# Patient Record
Sex: Female | Born: 1945 | Race: Black or African American | Hispanic: No | Smoking: Never smoker
Health system: Southern US, Community
[De-identification: ages and names within clinical notes are randomized; demographics above are authoritative.]

## PROBLEM LIST (undated history)

## (undated) HISTORY — PX: COSMETIC SURGERY: SHX468

## (undated) HISTORY — PX: TOTAL KNEE ARTHROPLASTY: SHX125

---

## 2008-10-18 ENCOUNTER — Emergency Department (HOSPITAL_BASED_OUTPATIENT_CLINIC_OR_DEPARTMENT_OTHER): Admission: EM | Admit: 2008-10-18 | Discharge: 2008-10-18 | Payer: Self-pay | Admitting: Emergency Medicine

## 2008-10-18 ENCOUNTER — Ambulatory Visit: Payer: Self-pay | Admitting: Diagnostic Radiology

## 2010-05-11 ENCOUNTER — Ambulatory Visit: Payer: Self-pay | Admitting: Interventional Radiology

## 2010-05-11 ENCOUNTER — Emergency Department (HOSPITAL_BASED_OUTPATIENT_CLINIC_OR_DEPARTMENT_OTHER): Admission: EM | Admit: 2010-05-11 | Discharge: 2010-05-11 | Payer: Self-pay | Admitting: Emergency Medicine

## 2010-05-22 ENCOUNTER — Emergency Department (HOSPITAL_BASED_OUTPATIENT_CLINIC_OR_DEPARTMENT_OTHER): Admission: EM | Admit: 2010-05-22 | Discharge: 2010-05-22 | Payer: Self-pay | Admitting: Emergency Medicine

## 2010-10-12 ENCOUNTER — Emergency Department (HOSPITAL_BASED_OUTPATIENT_CLINIC_OR_DEPARTMENT_OTHER)
Admission: EM | Admit: 2010-10-12 | Discharge: 2010-10-12 | Payer: Self-pay | Source: Home / Self Care | Admitting: Emergency Medicine

## 2011-05-15 ENCOUNTER — Encounter: Payer: Self-pay | Admitting: *Deleted

## 2011-05-15 ENCOUNTER — Emergency Department (HOSPITAL_BASED_OUTPATIENT_CLINIC_OR_DEPARTMENT_OTHER)
Admission: EM | Admit: 2011-05-15 | Discharge: 2011-05-15 | Disposition: A | Discharge status: Home / Self Care | Payer: BC Managed Care – PPO | Attending: Emergency Medicine | Admitting: Emergency Medicine

## 2011-05-15 ENCOUNTER — Emergency Department (INDEPENDENT_AMBULATORY_CARE_PROVIDER_SITE_OTHER): Payer: BC Managed Care – PPO

## 2011-05-15 DIAGNOSIS — S63509A Unspecified sprain of unspecified wrist, initial encounter: Secondary | ICD-10-CM | POA: Insufficient documentation

## 2011-05-15 DIAGNOSIS — E119 Type 2 diabetes mellitus without complications: Secondary | ICD-10-CM | POA: Insufficient documentation

## 2011-05-15 DIAGNOSIS — S61509A Unspecified open wound of unspecified wrist, initial encounter: Secondary | ICD-10-CM

## 2011-05-15 DIAGNOSIS — W19XXXA Unspecified fall, initial encounter: Secondary | ICD-10-CM

## 2011-05-15 DIAGNOSIS — M25569 Pain in unspecified knee: Secondary | ICD-10-CM

## 2011-05-15 DIAGNOSIS — Y9301 Activity, walking, marching and hiking: Secondary | ICD-10-CM | POA: Insufficient documentation

## 2011-05-15 DIAGNOSIS — S8000XA Contusion of unspecified knee, initial encounter: Secondary | ICD-10-CM | POA: Insufficient documentation

## 2011-05-15 DIAGNOSIS — S81009A Unspecified open wound, unspecified knee, initial encounter: Secondary | ICD-10-CM

## 2011-05-15 DIAGNOSIS — J45909 Unspecified asthma, uncomplicated: Secondary | ICD-10-CM | POA: Insufficient documentation

## 2011-05-15 DIAGNOSIS — Z96659 Presence of unspecified artificial knee joint: Secondary | ICD-10-CM

## 2011-05-15 DIAGNOSIS — Y92009 Unspecified place in unspecified non-institutional (private) residence as the place of occurrence of the external cause: Secondary | ICD-10-CM | POA: Insufficient documentation

## 2011-05-15 DIAGNOSIS — W010XXA Fall on same level from slipping, tripping and stumbling without subsequent striking against object, initial encounter: Secondary | ICD-10-CM | POA: Insufficient documentation

## 2011-05-15 MED ORDER — TETANUS-DIPHTH-ACELL PERTUSSIS 5-2-15.5 LF-MCG/0.5 IM SUSP
0.5000 mL | Freq: Once | INTRAMUSCULAR | Status: AC
  Administered 2011-05-15: 0.5 mL via INTRAMUSCULAR

## 2011-05-15 MED ORDER — HYDROCODONE-ACETAMINOPHEN 5-500 MG PO TABS: 1.0000 | ORAL_TABLET | Freq: Four times a day (QID) | ORAL | Status: AC | PRN

## 2011-05-15 MED ORDER — TETANUS-DIPHTH-ACELL PERTUSSIS 5-2-15.5 LF-MCG/0.5 IM SUSP
INTRAMUSCULAR | Status: AC
  Filled 2011-05-15: qty 0.5

## 2011-05-15 NOTE — ED Provider Notes (Signed)
History     Chief Complaint  Patient presents with  . Knee Injury  . Wrist Pain   Patient is a 65 y.o. female presenting with wrist pain and fall. The history is provided by the patient.  Wrist Pain This is a new problem. The symptoms are relieved by nothing.  Fall The accident occurred less than 1 hour ago. The fall occurred while walking. She fell from a height of 1 to 2 ft. She landed on a hard floor. There was no blood loss. The point of impact was the left wrist and right knee. The pain is mild. She was ambulatory at the scene. There was no drug use involved in the accident. Pertinent negatives include no fever and no numbness. She has tried nothing for the symptoms.  right knee and left wrist pain after fall.   Past Medical History  Diagnosis Date  . Asthma   . Diabetes mellitus     Past Surgical History  Procedure Date  . Cosmetic surgery     History reviewed. No pertinent family history.  History  Substance Use Topics  . Smoking status: Not on file  . Smokeless tobacco: Not on file  . Alcohol Use: No    OB History    Grav Para Term Preterm Abortions TAB SAB Ect Mult Living                  Review of Systems  Constitutional: Negative for fever and chills.  Gastrointestinal:       No abdominal pain,   Musculoskeletal: Negative for back pain and joint swelling.       Left wrist and right knee pain.   Neurological: Negative for weakness and numbness.    Physical Exam  SpO2 100%  Physical Exam  Constitutional: She appears well-developed.  HENT:  Head: Normocephalic and atraumatic.  Eyes: Pupils are equal, round, and reactive to light.  Neck: Normal range of motion. Neck supple.  Cardiovascular: Normal rate, regular rhythm and normal heart sounds.   Pulmonary/Chest: Effort normal.  Abdominal: Soft.  Musculoskeletal: She exhibits tenderness.       Arms:      Legs:      Tender with abrasion to right anterior knee. No deformity. Previous knee  replacement. Mild left wrist tenderness. ROM intact NV intact distally.     ED Course  Procedures  MDM Fall. Left wrist and right knee pain. Xrays negative. Ortho follow up.       Juliet Rude. Rubin Payor, MD 05/15/11 2202

## 2011-05-15 NOTE — ED Notes (Signed)
Patient is resting comfortably. 

## 2011-05-15 NOTE — ED Notes (Signed)
Family at bedside. 

## 2011-05-15 NOTE — ED Notes (Signed)
Pt states that her right foot slipped and she fell pt attempted to catch herself with left arm pt with abrasion to right knee and pain and swelling to left wrist

## 2011-05-15 NOTE — ED Notes (Signed)
Patient is resting comfortably. Wound cleaned awaiting radiology

## 2011-05-15 NOTE — ED Notes (Signed)
Delay explained to pt warm blankets brought will continue to monitor

## 2012-09-05 ENCOUNTER — Encounter (HOSPITAL_BASED_OUTPATIENT_CLINIC_OR_DEPARTMENT_OTHER): Payer: Self-pay | Admitting: *Deleted

## 2012-09-05 ENCOUNTER — Emergency Department (HOSPITAL_BASED_OUTPATIENT_CLINIC_OR_DEPARTMENT_OTHER)
Admission: EM | Admit: 2012-09-05 | Discharge: 2012-09-06 | Disposition: A | Discharge status: Home / Self Care | Payer: BC Managed Care – PPO | Attending: Emergency Medicine | Admitting: Emergency Medicine

## 2012-09-05 DIAGNOSIS — J45909 Unspecified asthma, uncomplicated: Secondary | ICD-10-CM | POA: Insufficient documentation

## 2012-09-05 DIAGNOSIS — R079 Chest pain, unspecified: Secondary | ICD-10-CM | POA: Insufficient documentation

## 2012-09-05 DIAGNOSIS — M25519 Pain in unspecified shoulder: Secondary | ICD-10-CM | POA: Insufficient documentation

## 2012-09-05 DIAGNOSIS — E119 Type 2 diabetes mellitus without complications: Secondary | ICD-10-CM | POA: Insufficient documentation

## 2012-09-05 DIAGNOSIS — Z79899 Other long term (current) drug therapy: Secondary | ICD-10-CM | POA: Insufficient documentation

## 2012-09-05 DIAGNOSIS — S46911A Strain of unspecified muscle, fascia and tendon at shoulder and upper arm level, right arm, initial encounter: Secondary | ICD-10-CM

## 2012-09-05 NOTE — ED Notes (Signed)
Pt c/o right chest pain radiating into her right shoulder and arm that began 4 days ago and has gotten progressively worse. Pt sts pain is worse with movement. Pt denies SOB, n/v or diaphoresis. Pt does heavy lifting at work.

## 2012-09-05 NOTE — ED Notes (Signed)
Encouraged Pt. To wait till Dr. Luberta Robertson her before she leaves the ED.  Pt. Mad about wait time.  Pt. In no distress and no chest pain.

## 2012-09-06 MED ORDER — NAPROXEN SODIUM 220 MG PO TABS: ORAL_TABLET | ORAL | Status: DC

## 2012-09-06 MED ORDER — NAPROXEN 250 MG PO TABS
500.0000 mg | ORAL_TABLET | Freq: Once | ORAL | Status: AC
  Administered 2012-09-06: 500 mg via ORAL
  Filled 2012-09-06: qty 2

## 2012-09-06 MED ORDER — HYDROCODONE-ACETAMINOPHEN 5-325 MG PO TABS: 1.0000 | ORAL_TABLET | Freq: Four times a day (QID) | ORAL | Status: DC | PRN

## 2012-09-06 NOTE — ED Provider Notes (Addendum)
History     CSN: [id number]  Arrival date & time 09/05/12  2041   First MD Initiated Contact with Patient 09/06/12 0002      Chief Complaint  Patient presents with  . Shoulder Pain    (Consider location/radiation/quality/duration/timing/severity/associated sxs/prior treatment) HPI This is a 66 year old female who has had pain in her right upper chest and shoulder for the past 4 days. She states she did a lot of lifting 4 days ago. The pain is moderate in severity and worse with palpation or movement. She denies shortness of breath, diaphoresis, nausea or vomiting. There is no associated deformity, motor deficit or sensory deficit.  Past Medical History  Diagnosis Date  . Asthma   . Diabetes mellitus     Past Surgical History  Procedure Date  . Cosmetic surgery   . Total knee arthroplasty     No family history on file.  History  Substance Use Topics  . Smoking status: Never Smoker   . Smokeless tobacco: Not on file  . Alcohol Use: No    OB History    Grav Para Term Preterm Abortions TAB SAB Ect Mult Living                  Review of Systems  All other systems reviewed and are negative.    Allergies  Review of patient's allergies indicates no known allergies.  Home Medications   Current Outpatient Rx  Name Route Sig Dispense Refill  . METFORMIN HCL ER (MOD) 1000 MG PO TB24 Oral Take 1,000 mg by mouth once.       BP 140/77  Pulse 78  Temp 98.4 F (36.9 C) (Oral)  Resp 20  Ht 5\' 5"  (1.651 m)  Wt 198 lb (89.812 kg)  BMI 32.95 kg/m2  SpO2 100%  Physical Exam General: Well-developed, well-nourished female in no acute distress; appearance consistent with age of record HENT: normocephalic, atraumatic Eyes: pupils equal round and reactive to light; extraocular muscles intact; arcus senilis bilaterally Neck: supple Heart: regular rate and rhythm Lungs: clear to auscultation bilaterally Chest: Tenderness of the right pectoralis muscles notably at  their insertion on the shoulder without swelling; no rotator cuff tenderness Abdomen: soft; nondistended; nontender Extremities: No deformity; full range of motion; right shoulder tenderness as noted above Neurologic: Awake, alert and oriented; motor function intact in all extremities and symmetric; no facial droop Skin: Warm and dry    ED Course  Procedures (including critical care time)     MDM    EKG Interpretation:  Date & Time: 09/05/2012 9:09 PM  Rate: 77  Rhythm: normal sinus rhythm  QRS Axis: normal  Intervals: normal  ST/T Wave abnormalities: normal  Conduction Disutrbances:none  Narrative Interpretation: Borderline LVH  Old EKG Reviewed: none available         Hanley Seamen, MD 09/06/12 0010  Hanley Seamen, MD 09/06/12 4098

## 2014-12-26 ENCOUNTER — Emergency Department (HOSPITAL_BASED_OUTPATIENT_CLINIC_OR_DEPARTMENT_OTHER)
Admission: EM | Admit: 2014-12-26 | Discharge: 2014-12-27 | Disposition: A | Discharge status: Other Acute Inpatient Hospital | Payer: Medicare HMO | Attending: Emergency Medicine | Admitting: Emergency Medicine

## 2014-12-26 ENCOUNTER — Encounter (HOSPITAL_BASED_OUTPATIENT_CLINIC_OR_DEPARTMENT_OTHER): Payer: Self-pay

## 2014-12-26 ENCOUNTER — Emergency Department (HOSPITAL_BASED_OUTPATIENT_CLINIC_OR_DEPARTMENT_OTHER): Payer: Medicare HMO

## 2014-12-26 DIAGNOSIS — Z79899 Other long term (current) drug therapy: Secondary | ICD-10-CM | POA: Insufficient documentation

## 2014-12-26 DIAGNOSIS — J45901 Unspecified asthma with (acute) exacerbation: Secondary | ICD-10-CM

## 2014-12-26 DIAGNOSIS — J441 Chronic obstructive pulmonary disease with (acute) exacerbation: Secondary | ICD-10-CM | POA: Diagnosis not present

## 2014-12-26 DIAGNOSIS — E119 Type 2 diabetes mellitus without complications: Secondary | ICD-10-CM | POA: Insufficient documentation

## 2014-12-26 DIAGNOSIS — R0602 Shortness of breath: Secondary | ICD-10-CM | POA: Diagnosis present

## 2014-12-26 LAB — BASIC METABOLIC PANEL
ANION GAP: 5 (ref 5–15)
BUN: 14 mg/dL (ref 6–23)
CHLORIDE: 108 mmol/L (ref 96–112)
CO2: 25 mmol/L (ref 19–32)
Calcium: 8.7 mg/dL (ref 8.4–10.5)
Creatinine, Ser: 1.07 mg/dL (ref 0.50–1.10)
GFR calc non Af Amer: 51 mL/min — ABNORMAL LOW (ref >90)
GFR, EST AFRICAN AMERICAN: 60 mL/min — AB (ref >90)
Glucose, Bld: 139 mg/dL — ABNORMAL HIGH (ref 70–99)
POTASSIUM: 4 mmol/L (ref 3.5–5.1)
Sodium: 138 mmol/L (ref 135–145)

## 2014-12-26 LAB — CBC WITH DIFFERENTIAL/PLATELET
Basophils Absolute: 0 10*3/uL (ref 0.0–0.1)
Basophils Relative: 0 % (ref 0–1)
Eosinophils Absolute: 0.3 10*3/uL (ref 0.0–0.7)
Eosinophils Relative: 7 % — ABNORMAL HIGH (ref 0–5)
HCT: 44.3 % (ref 36.0–46.0)
Hemoglobin: 13.8 g/dL (ref 12.0–15.0)
LYMPHS ABS: 1.5 10*3/uL (ref 0.7–4.0)
LYMPHS PCT: 36 % (ref 12–46)
MCH: 23.6 pg — ABNORMAL LOW (ref 26.0–34.0)
MCHC: 31.2 g/dL (ref 30.0–36.0)
MCV: 75.7 fL — AB (ref 78.0–100.0)
Monocytes Absolute: 0.5 10*3/uL (ref 0.1–1.0)
Monocytes Relative: 13 % — ABNORMAL HIGH (ref 3–12)
NEUTROS ABS: 1.8 10*3/uL (ref 1.7–7.7)
NEUTROS PCT: 44 % (ref 43–77)
PLATELETS: 199 10*3/uL (ref 150–400)
RBC: 5.85 MIL/uL — AB (ref 3.87–5.11)
RDW: 14 % (ref 11.5–15.5)
WBC: 4 10*3/uL (ref 4.0–10.5)

## 2014-12-26 LAB — BRAIN NATRIURETIC PEPTIDE: B NATRIURETIC PEPTIDE 5: 27 pg/mL (ref 0.0–100.0)

## 2014-12-26 LAB — TROPONIN I: Troponin I: <0.03 ng/mL (ref <0.031)

## 2014-12-26 MED ORDER — LEVOFLOXACIN IN D5W 500 MG/100ML IV SOLN
500.0000 mg | Freq: Once | INTRAVENOUS | Status: DC
  Filled 2014-12-26: qty 100

## 2014-12-26 MED ORDER — MAGNESIUM SULFATE 2 GM/50ML IV SOLN
2.0000 g | Freq: Once | INTRAVENOUS | Status: AC
  Administered 2014-12-26: 2 g via INTRAVENOUS
  Filled 2014-12-26: qty 50

## 2014-12-26 MED ORDER — IPRATROPIUM-ALBUTEROL 0.5-2.5 (3) MG/3ML IN SOLN
3.0000 mL | RESPIRATORY_TRACT | Status: DC
  Administered 2014-12-26: 3 mL via RESPIRATORY_TRACT
  Filled 2014-12-26: qty 3

## 2014-12-26 MED ORDER — PREDNISONE 50 MG PO TABS
60.0000 mg | ORAL_TABLET | Freq: Once | ORAL | Status: AC
  Administered 2014-12-26: 60 mg via ORAL
  Filled 2014-12-26 (×2): qty 1

## 2014-12-26 MED ORDER — IPRATROPIUM-ALBUTEROL 0.5-2.5 (3) MG/3ML IN SOLN
3.0000 mL | RESPIRATORY_TRACT | Status: DC
  Administered 2014-12-26: 3 mL via RESPIRATORY_TRACT
  Filled 2014-12-26 (×2): qty 3

## 2014-12-26 MED ORDER — IPRATROPIUM-ALBUTEROL 0.5-2.5 (3) MG/3ML IN SOLN
3.0000 mL | RESPIRATORY_TRACT | Status: DC
  Administered 2014-12-26: 3 mL via RESPIRATORY_TRACT

## 2014-12-26 NOTE — ED Provider Notes (Signed)
CSN: [id number]     Arrival date & time 12/26/14  1756 History   This chart was scribe for Glynn Octave, MD by Angelene Giovanni, ED Scribe. The patient was seen in room MHOTF/OTF and the patient's care was started at 6:16 PM.    Chief Complaint  Patient presents with  . Shortness of Breath   The history is provided by the patient. No language interpreter was used.  HPI Comments: Deniesha Stenglein is a 69 y.o. female with a hx of asthma, COPD, and DM who presents to the Emergency Department complaining of mid chest pain onset 2 days ago. She reports that she had cold symptoms 3 days ago but the SOB and CP started two days ago. She denies that the CP radiates and adds that the pain is worse when she pushes on her chest. She reports associated diaphoresis, fever and SOB. She reports that her throat is getting sore. She states that she only uses her inhaler on an as needed basis.   PCP: Dr. Lorenda Ishihara [facility])  Past Medical History  Diagnosis Date  . Asthma   . Diabetes mellitus    Past Surgical History  Procedure Laterality Date  . Cosmetic surgery    . Total knee arthroplasty     No family history on file. History  Substance Use Topics  . Smoking status: Never Smoker   . Smokeless tobacco: Not on file  . Alcohol Use: No   OB History    No data available     Review of Systems  Constitutional: Positive for fever and diaphoresis.  Respiratory: Positive for shortness of breath.   Cardiovascular: Positive for chest pain.  All other systems reviewed and are negative.     Allergies  Review of patient's allergies indicates no known allergies.  Home Medications   Prior to Admission medications   Medication Sig Start Date End Date Taking? Authorizing Provider  HYDROcodone-acetaminophen (NORCO/VICODIN) 5-325 MG per tablet Take 1-2 tablets by mouth every 6 (six) hours as needed ((severe pain)). 09/06/12   John L Molpus, MD  metFORMIN (GLUMETZA) 1000 MG  (MOD) 24 hr tablet Take 1,000 mg by mouth once.     Historical Provider, MD  naproxen sodium (ALEVE) 220 MG tablet Take 2 tablets every 12 hours for right chest and shoulder pain. 09/06/12   John L Molpus, MD   BP 128/82 mmHg  Pulse 102  Temp(Src) 98.5 F (36.9 C) (Oral)  Resp 20  SpO2 96% Physical Exam  Constitutional: She is oriented to person, place, and time. She appears well-developed and well-nourished. No distress.  HENT:  Head: Normocephalic and atraumatic.  Mouth/Throat: Oropharynx is clear and moist. No oropharyngeal exudate.  Eyes: Conjunctivae and EOM are normal. Pupils are equal, round, and reactive to light.  Neck: Normal range of motion. Neck supple.  No meningismus.  Cardiovascular: Normal rate, regular rhythm, normal heart sounds and intact distal pulses.   No murmur heard. Pulmonary/Chest: Effort normal and breath sounds normal. No respiratory distress.  Moderate respiratory distress  Decreased breath sounds bilaterally Reproducable chest wall  pain No peripheral edema Scattered expiratory wheezing   Abdominal: Soft. There is no tenderness. There is no rebound and no guarding.  Musculoskeletal: Normal range of motion. She exhibits no edema or tenderness.  Neurological: She is alert and oriented to person, place, and time. No cranial nerve deficit. She exhibits normal muscle tone. Coordination normal.  No ataxia on finger to nose bilaterally. No pronator drift. 5/5 strength  throughout. CN 2-12 intact. Negative Romberg. Equal grip strength. Sensation intact. Gait is normal.   Skin: Skin is warm.  Psychiatric: She has a normal mood and affect. Her behavior is normal.  Nursing note and vitals reviewed.   ED Course  Procedures (including critical care time) DIAGNOSTIC STUDIES: Oxygen Saturation is 96% on RA, adequate by my interpretation.    COORDINATION OF CARE: 6:22 PM- Pt advised of plan for treatment and pt agrees.    Labs Review Labs Reviewed  CBC WITH  DIFFERENTIAL/PLATELET - Abnormal; Notable for the following:    RBC 5.85 (*)    MCV 75.7 (*)    MCH 23.6 (*)    Monocytes Relative 13 (*)    Eosinophils Relative 7 (*)    All other components within normal limits  BASIC METABOLIC PANEL - Abnormal; Notable for the following:    Glucose, Bld 139 (*)    GFR calc non Af Amer 51 (*)    GFR calc Af Amer 60 (*)    All other components within normal limits  TROPONIN I  BRAIN NATRIURETIC PEPTIDE    Imaging Review Dg Chest 2 View  12/26/2014   CLINICAL DATA:  SOB, Mid chest tightness and congestion x 4 days. HX: Asthma, Diabetes, nonsmoker  EXAM: CHEST  2 VIEW  COMPARISON:  05/11/2010  FINDINGS: Cardiac silhouette normal in size and configuration. No mediastinal or hilar masses or evidence of adenopathy. Clear lungs. No pleural effusion or pneumothorax.  Bony thorax is demineralized but intact.  IMPRESSION: No active cardiopulmonary disease.   Electronically Signed   By: Amie Portlandavid  Ormond M.D.   On: 12/26/2014 19:25     EKG Interpretation   Date/Time:  Thursday December 26 2014 19:42:48 EST Ventricular Rate:  92 PR Interval:  190 QRS Duration: 124 QT Interval:  390 QTC Calculation: 482 R Axis:   -53 Text Interpretation:  Normal sinus rhythm Right bundle branch block Left  anterior fascicular block Abnormal ECG new bifasicular block Reconfirmed  by Earlyne Feeser  MD, Maelys Kinnick ([id number]) on 12/27/2014 12:16:08 AM      MDM   Final diagnoses:  Asthma exacerbation   Shortness of breath, cough, congestion since 10 AM. History of asthma. Has not been using her Advair appropriately. Used albuterol 2 today. Chest pain with coughing. Cough is nonproductive.  EKG is nonischemic. Chest x-ray is negative. Patient's chest pain is reproducible. Low suspicion for ACS.  Patient given nebulizers and steroids. Her work of breathing and wheezing has improved. She is aware the steroids may increase her blood sugars and she'll need to monitor them closely.  Given  3 nebulizers in the ED, magnesium and steroids. She is ambulatory without desaturation. However she continues to have dyspnea with conversation accessory muscle use with pursed lip breathing.  [facility] for further treatment of her asthma exacerbation. Discussed with Dr. Lucretia RoersWood.  CRITICAL CARE Performed by: Glynn OctaveANCOUR, Tamu Golz Total critical care time: 30 Critical care time was exclusive of separately billable procedures and treating other patients. Critical care was necessary to treat or prevent imminent or life-threatening deterioration. Critical care was time spent personally by me on the following activities: development of treatment plan with patient and/or surrogate as well as nursing, discussions with consultants, evaluation of patient's response to treatment, examination of patient, obtaining history from patient or surrogate, ordering and performing treatments and interventions, ordering and review of laboratory studies, ordering and review of radiographic studies, pulse oximetry and re-evaluation of patient's condition.  I personally performed the services described in this documentation, which was scribed in my presence. The recorded information has been reviewed and is accurate.   Glynn Octave, MD 12/27/14 [phone]

## 2014-12-26 NOTE — ED Notes (Signed)
Pt was having "fluttering in chest" after magnesium infusion started, rate slowed, Placed on cardiac monitor- pt in ST at 108-110, MD notified, infusion stopped.

## 2014-12-26 NOTE — ED Notes (Signed)
Reports shortness of breath since 1000 this AM. Hx asthma. Sts used albuterol inhaler x 2 at home

## 2014-12-26 NOTE — ED Notes (Signed)
Pt states her Nurse has already ambulated her. Clicking it complete

## 2014-12-27 DIAGNOSIS — R0602 Shortness of breath: Secondary | ICD-10-CM | POA: Diagnosis present

## 2014-12-27 DIAGNOSIS — E119 Type 2 diabetes mellitus without complications: Secondary | ICD-10-CM | POA: Diagnosis not present

## 2014-12-27 DIAGNOSIS — Z79899 Other long term (current) drug therapy: Secondary | ICD-10-CM | POA: Diagnosis not present

## 2014-12-27 DIAGNOSIS — J441 Chronic obstructive pulmonary disease with (acute) exacerbation: Secondary | ICD-10-CM | POA: Diagnosis not present

## 2015-10-05 ENCOUNTER — Encounter (HOSPITAL_BASED_OUTPATIENT_CLINIC_OR_DEPARTMENT_OTHER): Payer: Self-pay | Admitting: *Deleted

## 2015-10-05 ENCOUNTER — Emergency Department (HOSPITAL_BASED_OUTPATIENT_CLINIC_OR_DEPARTMENT_OTHER): Payer: Medicare HMO

## 2015-10-05 ENCOUNTER — Emergency Department (HOSPITAL_BASED_OUTPATIENT_CLINIC_OR_DEPARTMENT_OTHER)
Admission: EM | Admit: 2015-10-05 | Discharge: 2015-10-05 | Disposition: A | Discharge status: Home / Self Care | Payer: Medicare HMO | Attending: Emergency Medicine | Admitting: Emergency Medicine

## 2015-10-05 DIAGNOSIS — S8992XA Unspecified injury of left lower leg, initial encounter: Secondary | ICD-10-CM | POA: Diagnosis present

## 2015-10-05 DIAGNOSIS — Y9289 Other specified places as the place of occurrence of the external cause: Secondary | ICD-10-CM | POA: Diagnosis not present

## 2015-10-05 DIAGNOSIS — S80212A Abrasion, left knee, initial encounter: Secondary | ICD-10-CM

## 2015-10-05 DIAGNOSIS — S8002XA Contusion of left knee, initial encounter: Secondary | ICD-10-CM | POA: Diagnosis not present

## 2015-10-05 DIAGNOSIS — W010XXA Fall on same level from slipping, tripping and stumbling without subsequent striking against object, initial encounter: Secondary | ICD-10-CM | POA: Diagnosis not present

## 2015-10-05 DIAGNOSIS — J45909 Unspecified asthma, uncomplicated: Secondary | ICD-10-CM | POA: Diagnosis not present

## 2015-10-05 DIAGNOSIS — E119 Type 2 diabetes mellitus without complications: Secondary | ICD-10-CM | POA: Diagnosis not present

## 2015-10-05 DIAGNOSIS — Y9389 Activity, other specified: Secondary | ICD-10-CM | POA: Insufficient documentation

## 2015-10-05 DIAGNOSIS — Y998 Other external cause status: Secondary | ICD-10-CM | POA: Insufficient documentation

## 2015-10-05 MED ORDER — ACETAMINOPHEN 325 MG PO TABS
650.0000 mg | ORAL_TABLET | Freq: Once | ORAL | Status: AC
  Administered 2015-10-05: 650 mg via ORAL
  Filled 2015-10-05: qty 2

## 2015-10-05 NOTE — ED Notes (Signed)
Pt reports she tripped in parking lot and fell- hit left knee and left side- denies head injury or LOC- abrasion noted to left knee

## 2015-10-05 NOTE — ED Provider Notes (Signed)
CSN: [id number]     Arrival date & time 10/05/15  1347 History   First MD Initiated Contact with Patient 10/05/15 1415     Chief Complaint  Patient presents with  . Fall     (Consider location/radiation/quality/duration/timing/severity/associated sxs/prior Treatment) HPI Comments: Patient with h/o L knee replacement -- presents with complaint of left knee pain after fall. Patient states that she stepped in a divot and fell onto her left side. She denies hitting her head or hurting her neck. She denies left arm pain. She currently has abrasion and pain of her left knee. She was helped up with assistance and needed to "hop around". No treatments prior to arrival. Onset of symptoms acute. Course is constant. Movement makes the pain worse, being weight makes the pain worse.  The history is provided by the patient.    Past Medical History  Diagnosis Date  . Asthma   . Diabetes mellitus    Past Surgical History  Procedure Laterality Date  . Cosmetic surgery    . Total knee arthroplasty     No family history on file. Social History  Substance Use Topics  . Smoking status: Never Smoker   . Smokeless tobacco: Never Used  . Alcohol Use: No   OB History    No data available     Review of Systems  Constitutional: Negative for activity change.  Respiratory: Negative for shortness of breath.   Cardiovascular: Negative for chest pain.  Musculoskeletal: Positive for joint swelling and arthralgias. Negative for back pain and neck pain.  Skin: Negative for wound.  Neurological: Negative for dizziness, syncope, weakness and numbness.      Allergies  Review of patient's allergies indicates no known allergies.  Home Medications   Prior to Admission medications   Medication Sig Start Date End Date Taking? Authorizing Provider  metFORMIN (GLUMETZA) 1000 MG (MOD) 24 hr tablet Take 1,000 mg by mouth once.    Yes Historical Provider, MD  HYDROcodone-acetaminophen (NORCO/VICODIN) 5-325  MG per tablet Take 1-2 tablets by mouth every 6 (six) hours as needed ((severe pain)). 09/06/12   John Molpus, MD  naproxen sodium (ALEVE) 220 MG tablet Take 2 tablets every 12 hours for right chest and shoulder pain. 09/06/12   John Molpus, MD   BP 135/78 mmHg  Pulse 72  Temp(Src) 98.1 F (36.7 C) (Oral)  Resp 18  Ht 5\' 4"  (1.626 m)  Wt 95.255 kg  BMI 36.03 kg/m2  SpO2 97% Physical Exam  Constitutional: She appears well-developed and well-nourished.  HENT:  Head: Normocephalic and atraumatic.  Eyes: Pupils are equal, round, and reactive to light.  Neck: Normal range of motion. Neck supple.  Cardiovascular: Exam reveals no decreased pulses.   Musculoskeletal: She exhibits tenderness. She exhibits no edema.       Left hip: Normal.       Left knee: She exhibits swelling. She exhibits normal range of motion and no effusion. Tenderness (Anteriorly over abrasion.) found.       Left ankle: Normal.       Legs: Neurological: She is alert. No sensory deficit.  Motor, sensation, and vascular distal to the injury is fully intact.   Skin: Skin is warm and dry.  Psychiatric: She has a normal mood and affect.  Nursing note and vitals reviewed.   ED Course  Procedures (including critical care time) Labs Review Labs Reviewed - No data to display  Imaging Review Dg Knee Complete 4 Views Left  10/05/2015  CLINICAL DATA:  Fell and hit left knee on cement, abrasion below the patella EXAM: LEFT KNEE - COMPLETE 4+ VIEW COMPARISON:  10/12/2010 FINDINGS: Tricompartmental knee prosthesis. It appears intact and with components in anticipated position. No fracture or dislocation. There is soft tissue fullness and hyper attenuation inferior to the patella. IMPRESSION: No fracture or dislocation. Soft tissue fullness inferior to the patella consistent with history. MRI would be required to assess integrity of the patellar tendon if indicated. Electronically Signed   By: Esperanza Heir M.D.   On:  10/05/2015 14:46   I have personally reviewed and evaluated these images and lab results as part of my medical decision-making.   EKG Interpretation None        2:36 PM Patient seen and examined. Work-up initiated. Medications ordered.   Vital signs reviewed and are as follows: BP 135/78 mmHg  Pulse 72  Temp(Src) 98.1 F (36.7 C) (Oral)  Resp 18  Ht  (1.626 m)  Wt 95.255 kg  BMI 36.03 kg/m2  SpO2 97%  3:30 PM X-rays negative. Patient discussed with and seen by Dr. Rennis Chris. Patient has ambulated. Dressing applied to abrasion. Discharge to home with conservative measures, rice protocol, follow-up as needed.  MDM   Final diagnoses:  Knee abrasion, left, initial encounter  Knee contusion, left, initial encounter   Patient with left knee pain after mechanical fall. X-rays are negative. Abrasion noted without laceration. Patient is ambulatory with soreness. Conservative measures indicated. Lower extremity is neurovascularly intact. No head or neck injury suspected.    Renne Crigler, PA-C 10/05/15 1531  Doug Sou, MD 10/05/15 [phone]

## 2015-10-05 NOTE — ED Notes (Signed)
Pt ambulated in hallway with no issues other than soreness from fall.  HR 77, sats 98% on RA.  No dizziness or SOB noted.  RN notified.

## 2015-10-05 NOTE — ED Provider Notes (Signed)
Patient tripped and fell 1 PM today, she complains of left knee pain no pain elsewhere. Pain is worse with weightbearing. No other associated symptoms. On exam alert, Glasgow coma score 15 left lower extremity 2 cm abrasion overlying the patella. No soft tissue swelling. No ligamentous laxity. Walks with a slight limp favoring left lower extremity. X-ray reviewed by me Clinically patient does not have ligamentous injury. Patient is current on tetanus immunization  Doug SouSam Peggyann Zwiefelhofer, MD 10/05/15 1517

## 2015-10-05 NOTE — Discharge Instructions (Signed)
Please read and follow all provided instructions.  Your diagnoses today include:  1. Knee abrasion, left, initial encounter   2. Knee contusion, left, initial encounter     Tests performed today include:  An x-ray of the affected area - does NOT show any broken bones  Vital signs. See below for your results today.   Medications prescribed:   None  Take any prescribed medications only as directed.  Home care instructions:   Follow any educational materials contained in this packet  Follow R.I.C.E. Protocol:  R - rest your injury   I  - use ice on injury without applying directly to skin  C - compress injury with bandage or splint  E - elevate the injury as much as possible  Follow-up instructions: Please follow-up with your primary care provider or your orthopedic physician if you continue to have significant pain in 1 week. In this case you may have a more severe injury that requires further care.   Return instructions:   Please return if your toes or feet are numb or tingling, appear gray or blue, or you have severe pain (also elevate the leg and loosen splint or wrap if you were given one)  Please return to the Emergency Department if you experience worsening symptoms.   Please return if you have any other emergent concerns.  Additional Information:  Your vital signs today were: BP 135/78 mmHg   Pulse 72   Temp(Src) 98.1 F (36.7 C) (Oral)   Resp 18   Ht 5\' 4"  (1.626 m)   Wt 95.255 kg   BMI 36.03 kg/m2   SpO2 97% If your blood pressure (BP) was elevated above 135/85 this visit, please have this repeated by your doctor within one month. --------------

## 2018-11-08 HISTORY — PX: BACK SURGERY: SHX140

## 2019-01-14 ENCOUNTER — Other Ambulatory Visit: Payer: Self-pay

## 2019-01-14 ENCOUNTER — Emergency Department (HOSPITAL_BASED_OUTPATIENT_CLINIC_OR_DEPARTMENT_OTHER): Payer: Medicare HMO

## 2019-01-14 ENCOUNTER — Emergency Department (HOSPITAL_BASED_OUTPATIENT_CLINIC_OR_DEPARTMENT_OTHER)
Admission: EM | Admit: 2019-01-14 | Discharge: 2019-01-14 | Disposition: A | Discharge status: Home / Self Care | Payer: Medicare HMO | Attending: Emergency Medicine | Admitting: Emergency Medicine

## 2019-01-14 ENCOUNTER — Encounter (HOSPITAL_BASED_OUTPATIENT_CLINIC_OR_DEPARTMENT_OTHER): Payer: Self-pay | Admitting: Emergency Medicine

## 2019-01-14 DIAGNOSIS — J45909 Unspecified asthma, uncomplicated: Secondary | ICD-10-CM | POA: Diagnosis not present

## 2019-01-14 DIAGNOSIS — H9311 Tinnitus, right ear: Secondary | ICD-10-CM | POA: Diagnosis not present

## 2019-01-14 DIAGNOSIS — Z7984 Long term (current) use of oral hypoglycemic drugs: Secondary | ICD-10-CM | POA: Insufficient documentation

## 2019-01-14 DIAGNOSIS — H9313 Tinnitus, bilateral: Secondary | ICD-10-CM | POA: Diagnosis present

## 2019-01-14 DIAGNOSIS — Z96659 Presence of unspecified artificial knee joint: Secondary | ICD-10-CM | POA: Diagnosis not present

## 2019-01-14 DIAGNOSIS — R42 Dizziness and giddiness: Secondary | ICD-10-CM | POA: Insufficient documentation

## 2019-01-14 DIAGNOSIS — Z79899 Other long term (current) drug therapy: Secondary | ICD-10-CM | POA: Insufficient documentation

## 2019-01-14 DIAGNOSIS — E119 Type 2 diabetes mellitus without complications: Secondary | ICD-10-CM | POA: Diagnosis not present

## 2019-01-14 LAB — CBC WITH DIFFERENTIAL/PLATELET
ABS IMMATURE GRANULOCYTES: 0.01 10*3/uL (ref 0.00–0.07)
BASOS ABS: 0 10*3/uL (ref 0.0–0.1)
BASOS PCT: 0 %
EOS ABS: 0.1 10*3/uL (ref 0.0–0.5)
Eosinophils Relative: 3 %
HCT: 44.3 % (ref 36.0–46.0)
Hemoglobin: 12.7 g/dL (ref 12.0–15.0)
IMMATURE GRANULOCYTES: 0 %
Lymphocytes Relative: 42 %
Lymphs Abs: 1.8 10*3/uL (ref 0.7–4.0)
MCH: 22.1 pg — ABNORMAL LOW (ref 26.0–34.0)
MCHC: 28.7 g/dL — ABNORMAL LOW (ref 30.0–36.0)
MCV: 77.2 fL — ABNORMAL LOW (ref 80.0–100.0)
MONOS PCT: 10 %
Monocytes Absolute: 0.4 10*3/uL (ref 0.1–1.0)
NEUTROS ABS: 2 10*3/uL (ref 1.7–7.7)
NEUTROS PCT: 45 %
NRBC: 0 % (ref 0.0–0.2)
PLATELETS: 246 10*3/uL (ref 150–400)
RBC: 5.74 MIL/uL — ABNORMAL HIGH (ref 3.87–5.11)
RDW: 13.8 % (ref 11.5–15.5)
WBC: 4.3 10*3/uL (ref 4.0–10.5)

## 2019-01-14 LAB — COMPREHENSIVE METABOLIC PANEL
ALBUMIN: 3.8 g/dL (ref 3.5–5.0)
ALK PHOS: 71 U/L (ref 38–126)
ALT: 24 U/L (ref 0–44)
ANION GAP: 7 (ref 5–15)
AST: 39 U/L (ref 15–41)
BUN: 13 mg/dL (ref 8–23)
CALCIUM: 8.8 mg/dL — AB (ref 8.9–10.3)
CO2: 25 mmol/L (ref 22–32)
Chloride: 106 mmol/L (ref 98–111)
Creatinine, Ser: 1.01 mg/dL — ABNORMAL HIGH (ref 0.44–1.00)
GFR calc Af Amer: >60 mL/min (ref >60)
GFR calc non Af Amer: 55 mL/min — ABNORMAL LOW (ref >60)
GLUCOSE: 125 mg/dL — AB (ref 70–99)
POTASSIUM: 4 mmol/L (ref 3.5–5.1)
Sodium: 138 mmol/L (ref 135–145)
Total Bilirubin: 0.5 mg/dL (ref 0.3–1.2)
Total Protein: 7.6 g/dL (ref 6.5–8.1)

## 2019-01-14 MED ORDER — OXYMETAZOLINE HCL 0.05 % NA SOLN
1.0000 | Freq: Once | NASAL | Status: AC
Start: 2019-01-14 — End: 2019-01-14
  Administered 2019-01-14: 1 via NASAL
  Filled 2019-01-14: qty 30

## 2019-01-14 MED ORDER — IOPAMIDOL (ISOVUE-370) INJECTION 76%
100.0000 mL | Freq: Once | INTRAVENOUS | Status: AC | PRN
  Administered 2019-01-14: 100 mL via INTRAVENOUS

## 2019-01-14 NOTE — ED Notes (Signed)
ED Provider at bedside. 

## 2019-01-14 NOTE — ED Notes (Deleted)
Discharge/transfer information charted on this patient in error.

## 2019-01-14 NOTE — ED Notes (Signed)
Pt c/o nasal congestion with bilateral ringing in her ears and dizziness. Also c/o headache x 4 days. Pt unsure if fever. Denies coughing, sob, pt verbalizes hx of asthma.

## 2019-01-14 NOTE — ED Notes (Signed)
Pts daughter states pt is non compliant with b/p medication

## 2019-01-14 NOTE — ED Notes (Signed)
Pt returned from CT, new IV started in CT 20 G right AC, previously existing iv would not work for angio study, Charity fundraiser notified, pt in restroom, not hooked back up to monitor, also notified RN.

## 2019-01-14 NOTE — Discharge Instructions (Signed)
Use the afrin nasal spray, one spray in each nostril twice daily for the next three days (last dose will be on Wednesday).   Get rechecked immediately if you have any new or concerning symptoms.

## 2019-01-14 NOTE — ED Notes (Signed)
Pt ambulated unassisted to room

## 2019-01-14 NOTE — ED Provider Notes (Signed)
[facility] EMERGENCY DEPARTMENT Provider Note   CSN: [id number] Arrival date & time: 01/14/19  1506    History   Chief Complaint Chief Complaint  Patient presents with  . Dizziness    HPI Autumn Barton is a 73 y.o. female.     The history is provided by the patient, medical records and a relative. No language interpreter was used.  Dizziness   Autumn Barton is a 73 y.o. female who presents to the Emergency Department complaining of tinnitus. She presents to the emergency department accompanied by her daughter for evaluation of tenderness that began three days ago. Symptoms are constant and gradual and onset. They are progressively worsening. Her tenderness is described as a frying grease or bees in her ears sensation. The right ear is significantly worse than the left. She reports associated malaise, nausea, dizziness, rhinorrhea. She has mild associated frontal headache. She denies any fevers, chills, cough, shortness of breath, abdominal pain, numbness, weakness. She did recently have back surgery at the end of January. No recent medication changes. She states she is not compliant with her blood pressure medication. Past Medical History:  Diagnosis Date  . Asthma   . Diabetes mellitus     Patient Active Problem List   Diagnosis Date Noted  . Diabetes mellitus     Past Surgical History:  Procedure Laterality Date  . COSMETIC SURGERY    . TOTAL KNEE ARTHROPLASTY       OB History   No obstetric history on file.      Home Medications    Prior to Admission medications   Medication Sig Start Date End Date Taking? Authorizing Provider  alendronate (FOSAMAX) 35 MG tablet Take by mouth. 11/03/18  Yes [provider]  diazepam (VALIUM) 5 MG tablet Take 1 tablet 30 minutes before procedure 10/22/16  Yes [provider]  gabapentin (NEURONTIN) 100 MG capsule Take 1-3 capsules at bedtime as needed for leg pain. 10/22/16  Yes [provider]  lisinopril (PRINIVIL,ZESTRIL) 10 MG tablet Take by mouth. 08/27/18  Yes [provider]  pravastatin (PRAVACHOL) 40 MG tablet TAKE 1 TABLET BY MOUTH DAILY 02/10/17  Yes [provider]  Vitamin D, Ergocalciferol, (DRISDOL) 1.25 MG (50000 UT) CAPS capsule Take by mouth. 09/23/16  Yes [provider]  albuterol (PROVENTIL HFA;VENTOLIN HFA) 108 (90 Base) MCG/ACT inhaler Inhale into the lungs.    [provider]  metFORMIN (GLUMETZA) 1000 MG (MOD) 24 hr tablet Take 1,000 mg by mouth once.     [provider]    Family History History reviewed. No pertinent family history.  Social History Social History   Tobacco Use  . Smoking status: Never Smoker  . Smokeless tobacco: Never Used  Substance Use Topics  . Alcohol use: No  . Drug use: No     Allergies   Aspirin and Ibuprofen   Review of Systems Review of Systems  Neurological: Positive for dizziness.  All other systems reviewed and are negative.    Physical Exam Updated Vital Signs BP (!) 162/86   Pulse 68   Temp 98 F (36.7 C) (Oral)   Resp 18   Ht  (1.575 m)   Wt 95.3 kg   SpO2 100%   BMI 38.41 kg/m   Physical Exam Vitals signs and nursing note reviewed.  Constitutional:      Appearance: She is well-developed.  HENT:     Head: Normocephalic and atraumatic.     Right Ear: Tympanic  membrane normal.     Left Ear: Tympanic membrane normal.     Nose: Nose normal.     Mouth/Throat:     Mouth: Mucous membranes are moist.  Eyes:     Extraocular Movements: Extraocular movements intact.     Pupils: Pupils are equal, round, and reactive to light.  Cardiovascular:     Rate and Rhythm: Normal rate and regular rhythm.     Heart sounds: No murmur.  Pulmonary:     Effort: Pulmonary effort is normal. No respiratory distress.     Breath sounds: Normal breath sounds.  Abdominal:     Palpations: Abdomen is soft.     Tenderness: There is no abdominal  tenderness. There is no guarding or rebound.  Musculoskeletal:        General: No swelling or tenderness.  Skin:    General: Skin is warm and dry.  Neurological:     Mental Status: She is alert and oriented to person, place, and time.     Comments: No asymmetry of facial movement. No pronator drift. Five out of five strength in all four extremities with sensation to light touch intact in all four extremities. Visual fields are grossly intact.  Psychiatric:        Behavior: Behavior normal.      ED Treatments / Results  Labs (all labs ordered are listed, but only abnormal results are displayed) Labs Reviewed  COMPREHENSIVE METABOLIC PANEL - Abnormal; Notable for the following components:      Result Value   Glucose, Bld 125 (*)    Creatinine, Ser 1.01 (*)    Calcium 8.8 (*)    GFR calc non Af Amer 55 (*)    All other components within normal limits  CBC WITH DIFFERENTIAL/PLATELET - Abnormal; Notable for the following components:   RBC 5.74 (*)    MCV 77.2 (*)    MCH 22.1 (*)    MCHC 28.7 (*)    All other components within normal limits    EKG None  Radiology Ct Angio Head W/cm &/or Wo Cm  Result Date: 01/14/2019 CLINICAL DATA:  Ringing in ears and dizziness for 3 days. Nasal congestion. EXAM: CT ANGIOGRAPHY HEAD AND NECK TECHNIQUE: Multidetector CT imaging of the head and neck was performed using the standard protocol during bolus administration of intravenous contrast. Multiplanar CT image reconstructions and MIPs were obtained to evaluate the vascular anatomy. Carotid stenosis measurements (when applicable) are obtained utilizing NASCET criteria, using the distal internal carotid diameter as the denominator. CONTRAST:  ISOVUE-370 IOPAMIDOL (ISOVUE-370) INJECTION 76% COMPARISON:  MRI brain 12/30/2014 FINDINGS: CT HEAD FINDINGS Brain: Atrophy and moderate white matter disease is stable. No acute cortical infarct, hemorrhage, or mass lesion is present. Basal ganglia are  intact. Insular ribbon is normal. The ventricles are of proportionate to the degree of atrophy. No significant extraaxial fluid collection is present. The brainstem and cerebellum are within normal limits. Vascular: No hyperdense vessel or unexpected calcification. Skull: Calvarium is intact. No focal lytic or blastic lesions are present. Sinuses: A polyp or mucous retention cyst is present in the right maxillary sinus. Orbits: Bilateral lens replacements are present. Globes and orbits are within normal limits otherwise. Review of the MIP images confirms the above findings CTA NECK FINDINGS Aortic arch: There is a common origin of the left common carotid artery and innominate artery. Mild atherosclerotic changes are noted at the aortic arch without aneurysm. Right carotid system: Right common carotid artery is within normal limits.  Atherosclerotic calcifications are present at the bifurcation. There is no significant stenosis. Moderate tortuosity is present in the cervical right ICA. There is no significant stenosis of greater than 50%. Left carotid system: Mild tortuosity is present in the left common carotid artery. Atherosclerotic changes are present with calcified and noncalcified plaque at the left carotid bifurcation. There is no significant stenosis. There is some calcification in the distal left ICA just below the skull base without significant stenosis. Vertebral arteries: The vertebral arteries are codominant. Both vertebral arteries originate from the subclavian arteries. There is a high-grade stenosis in the proximal right vertebral artery and at the C6 level on the right. No distal stenosis ease are present in the neck. There is no focal stenosis or vascular injury to the left vertebral artery in the neck. Skeleton: Multilevel degenerative changes are present in the cervical spine. There straightening of the normal cervical lordosis. Facet and uncovertebral disease contributes to foraminal narrowing on  the right at C5-6 and C6-7. The patient is edentulous. Other neck: No focal mucosal or submucosal lesions are present. Salivary glands are within normal limits. No significant adenopathy is present. Thyroid is normal Upper chest: Lung apices are clear. There is no focal nodule, mass, or airspace disease. Thoracic inlet is within normal limits. Review of the MIP images confirms the above findings CTA HEAD FINDINGS Anterior circulation: Atherosclerotic calcifications are present within the cavernous internal carotid arteries bilaterally without significant stenosis relative to the more distal ICA termini. The A1 and M1 segments are within normal limits. The anterior communicating artery is patent. MCA bifurcations are intact. ACA and MCA branch vessels are within normal limits. No aberrant vessel or aneurysm is present to explain tinnitus. Posterior circulation: Left vertebral artery is the dominant vessel. PICA origins are visualized and normal. Vertebrobasilar junction is normal. The right posterior cerebral artery originates from basilar tip. The left posterior cerebral artery is of fetal type. PCA branch vessels are within normal limits bilaterally. Venous sinuses: Dural sinuses are patent. Straight sinus and deep cerebral veins are intact. Cortical veins are unremarkable. Anatomic variants: Fetal type left posterior cerebral artery. Delayed phase: Delayed images demonstrate no pathologic enhancement. Review of the MIP images confirms the above findings IMPRESSION: 1. No acute or focal abnormality to explain tinnitus. 2. Atrophy and white matter disease bilaterally consistent with chronic microvascular ischemia. 3. Atherosclerotic changes at the carotid bifurcations bilaterally without a significant stenosis relative to the more distal vessels. 4. Calcifications in the distal left ICA just below the skull base may be posttraumatic. There is no aneurysm. 5. Atherosclerotic calcifications at the cavernous internal  carotid arteries bilaterally without significant stenosis. 6. Multilevel degenerative changes in the cervical spine. Electronically Signed   By: Christopher  Mattern M.D.   On: 01/14/2019 16:55   Ct Angio Neck W And/or Wo Contrast  Result Date: 01/14/2019 CLINICAL DATA:  Ringing in ears and dizziness for 3 days. Nasal congestion. EXAM: CT ANGIOGRAPHY HEAD AND NECK TECHNIQUE: Multidetector CT imaging of the head and neck was performed using the standard protocol during bolus administration of intravenous contrast. Multiplanar CT image reconstructions and MIPs were obtained to evaluate the vascular anatomy. Carotid stenosis measurements (when applicable) are obtained utilizing NASCET criteria, using the distal internal carotid diameter as the denominator. CONTRAST:  <MEASUREMENCh Ch Charlett Nose-370 IOPAMIDOL (ISOVUE-370) INJECTION 76% COMPARISON:  MRI brain 12/30/2014 FINDINGS: CT HEAD FINDINGS Brain: Atrophy and moderate white matter disease is stable. No acute cortical infarct, hemorrhage, or mass lesion is present. Basal ganglia are  intact. Insular ribbon is normal. The ventricles are of proportionate to the degree of atrophy. No significant extraaxial fluid collection is present. The brainstem and cerebellum are within normal limits. Vascular: No hyperdense vessel or unexpected calcification. Skull: Calvarium is intact. No focal lytic or blastic lesions are present. Sinuses: A polyp or mucous retention cyst is present in the right maxillary sinus. Orbits: Bilateral lens replacements are present. Globes and orbits are within normal limits otherwise. Review of the MIP images confirms the above findings CTA NECK FINDINGS Aortic arch: There is a common origin of the left common carotid artery and innominate artery. Mild atherosclerotic changes are noted at the aortic arch without aneurysm. Right carotid system: Right common carotid artery is within normal limits. Atherosclerotic calcifications are present at the bifurcation. There  is no significant stenosis. Moderate tortuosity is present in the cervical right ICA. There is no significant stenosis of greater than 50%. Left carotid system: Mild tortuosity is present in the left common carotid artery. Atherosclerotic changes are present with calcified and noncalcified plaque at the left carotid bifurcation. There is no significant stenosis. There is some calcification in the distal left ICA just below the skull base without significant stenosis. Vertebral arteries: The vertebral arteries are codominant. Both vertebral arteries originate from the subclavian arteries. There is a high-grade stenosis in the proximal right vertebral artery and at the C6 level on the right. No distal stenosis ease are present in the neck. There is no focal stenosis or vascular injury to the left vertebral artery in the neck. Skeleton: Multilevel degenerative changes are present in the cervical spine. There straightening of the normal cervical lordosis. Facet and uncovertebral disease contributes to foraminal narrowing on the right at C5-6 and C6-7. The patient is edentulous. Other neck: No focal mucosal or submucosal lesions are present. Salivary glands are within normal limits. No significant adenopathy is present. Thyroid is normal Upper chest: Lung apices are clear. There is no focal nodule, mass, or airspace disease. Thoracic inlet is within normal limits. Review of the MIP images confirms the above findings CTA HEAD FINDINGS Anterior circulation: Atherosclerotic calcifications are present within the cavernous internal carotid arteries bilaterally without significant stenosis relative to the more distal ICA termini. The A1 and M1 segments are within normal limits. The anterior communicating artery is patent. MCA bifurcations are intact. ACA and MCA branch vessels are within normal limits. No aberrant vessel or aneurysm is present to explain tinnitus. Posterior circulation: Left vertebral artery is the dominant  vessel. PICA origins are visualized and normal. Vertebrobasilar junction is normal. The right posterior cerebral artery originates from basilar tip. The left posterior cerebral artery is of fetal type. PCA branch vessels are within normal limits bilaterally. Venous sinuses: Dural sinuses are patent. Straight sinus and deep cerebral veins are intact. Cortical veins are unremarkable. Anatomic variants: Fetal type left posterior cerebral artery. Delayed phase: Delayed images demonstrate no pathologic enhancement. Review of the MIP images confirms the above findings IMPRESSION: 1. No acute or focal abnormality to explain tinnitus. 2. Atrophy and white matter disease bilaterally consistent with chronic microvascular ischemia. 3. Atherosclerotic changes at the carotid bifurcations bilaterally without a significant stenosis relative to the more distal vessels. 4. Calcifications in the distal left ICA just below the skull base may be posttraumatic. There is no aneurysm. 5. Atherosclerotic calcifications at the cavernous internal carotid arteries bilaterally without significant stenosis. 6. Multilevel degenerative changes in the cervical spine. Electronically Signed   By: Virl Son.D.  On: 01/14/2019 16:55    Procedures Procedures (including critical care time)  Medications Ordered in ED Medications  oxymetazoline (AFRIN) 0.05 % nasal spray 1 spray (has no administration in time range)  iopamidol (ISOVUE-370) 76 % injection 100 mL (100 mLs Intravenous Contrast Given 01/14/19 1619)     Initial Impression / Assessment and Plan / ED Course  I have reviewed the triage vital signs and the nursing notes.  Pertinent labs & imaging results that were available during my care of the patient were reviewed by me and considered in my medical decision making (see chart for details).        Patient with history of hypertension and diabetes here for evaluation of tinnitus, dizziness, nasal congestion. She  is non-toxic appearing on evaluation with no neurologic deficits. No evidence of acute otitis media, sinusitis. CTA obtained, which is negative for vascular lesion. Will treat for possible eustachian tube dysfunction with Afrin. Discussed home care for tenderness. Discussed outpatient follow-up and return precautions.  Discussed with patient findings of CT scan and recommendation for outpatient follow-up.  Patient denies aspirin ingestion, she is not on any ototoxic medications.  Final Clinical Impressions(s) / ED Diagnoses   Final diagnoses:  Tinnitus of right ear    ED Discharge Orders    None       Tilden Fossa, MD 01/14/19 1742

## 2019-01-14 NOTE — ED Triage Notes (Signed)
Patient has had dizziness and ringing in her ears since Thursday  - the patient reports " I just fell terrible". The patient reports that she is having nausea

## 2019-01-25 ENCOUNTER — Ambulatory Visit: Payer: Self-pay | Admitting: Allergy and Immunology

## 2019-02-07 ENCOUNTER — Ambulatory Visit: Payer: Self-pay | Admitting: Allergy and Immunology

## 2019-03-21 ENCOUNTER — Ambulatory Visit: Payer: Self-pay | Admitting: Allergy and Immunology

## 2019-11-03 ENCOUNTER — Encounter (HOSPITAL_BASED_OUTPATIENT_CLINIC_OR_DEPARTMENT_OTHER): Payer: Self-pay

## 2019-11-03 ENCOUNTER — Emergency Department (HOSPITAL_BASED_OUTPATIENT_CLINIC_OR_DEPARTMENT_OTHER)
Admission: EM | Admit: 2019-11-03 | Discharge: 2019-11-03 | Disposition: A | Discharge status: Home / Self Care | Payer: Medicare HMO | Attending: Emergency Medicine | Admitting: Emergency Medicine

## 2019-11-03 ENCOUNTER — Emergency Department (HOSPITAL_BASED_OUTPATIENT_CLINIC_OR_DEPARTMENT_OTHER): Payer: Medicare HMO

## 2019-11-03 ENCOUNTER — Other Ambulatory Visit: Payer: Self-pay

## 2019-11-03 DIAGNOSIS — J45909 Unspecified asthma, uncomplicated: Secondary | ICD-10-CM | POA: Insufficient documentation

## 2019-11-03 DIAGNOSIS — U071 COVID-19: Secondary | ICD-10-CM

## 2019-11-03 DIAGNOSIS — R509 Fever, unspecified: Secondary | ICD-10-CM

## 2019-11-03 DIAGNOSIS — E119 Type 2 diabetes mellitus without complications: Secondary | ICD-10-CM | POA: Insufficient documentation

## 2019-11-03 DIAGNOSIS — Z7984 Long term (current) use of oral hypoglycemic drugs: Secondary | ICD-10-CM | POA: Diagnosis not present

## 2019-11-03 DIAGNOSIS — Z79899 Other long term (current) drug therapy: Secondary | ICD-10-CM | POA: Insufficient documentation

## 2019-11-03 DIAGNOSIS — R439 Unspecified disturbances of smell and taste: Secondary | ICD-10-CM | POA: Diagnosis present

## 2019-11-03 LAB — COMPREHENSIVE METABOLIC PANEL
ALT: 18 U/L (ref 0–44)
AST: 38 U/L (ref 15–41)
Albumin: 3.3 g/dL — ABNORMAL LOW (ref 3.5–5.0)
Alkaline Phosphatase: 46 U/L (ref 38–126)
Anion gap: 10 (ref 5–15)
BUN: 10 mg/dL (ref 8–23)
CO2: 24 mmol/L (ref 22–32)
Calcium: 8.2 mg/dL — ABNORMAL LOW (ref 8.9–10.3)
Chloride: 103 mmol/L (ref 98–111)
Creatinine, Ser: 1.16 mg/dL — ABNORMAL HIGH (ref 0.44–1.00)
GFR calc Af Amer: 53 mL/min — ABNORMAL LOW (ref >60)
GFR calc non Af Amer: 46 mL/min — ABNORMAL LOW (ref >60)
Glucose, Bld: 163 mg/dL — ABNORMAL HIGH (ref 70–99)
Potassium: 3.9 mmol/L (ref 3.5–5.1)
Sodium: 137 mmol/L (ref 135–145)
Total Bilirubin: 0.2 mg/dL — ABNORMAL LOW (ref 0.3–1.2)
Total Protein: 7 g/dL (ref 6.5–8.1)

## 2019-11-03 LAB — URINALYSIS, ROUTINE W REFLEX MICROSCOPIC
Bilirubin Urine: NEGATIVE
Glucose, UA: NEGATIVE mg/dL
Ketones, ur: NEGATIVE mg/dL
Leukocytes,Ua: NEGATIVE
Nitrite: NEGATIVE
Protein, ur: 30 mg/dL — AB
Specific Gravity, Urine: >1.03 — ABNORMAL HIGH (ref 1.005–1.030)
pH: 6 (ref 5.0–8.0)

## 2019-11-03 LAB — CBC WITH DIFFERENTIAL/PLATELET
Abs Immature Granulocytes: 0.02 10*3/uL (ref 0.00–0.07)
Basophils Absolute: 0 10*3/uL (ref 0.0–0.1)
Basophils Relative: 0 %
Eosinophils Absolute: 0 10*3/uL (ref 0.0–0.5)
Eosinophils Relative: 1 %
HCT: 39.6 % (ref 36.0–46.0)
Hemoglobin: 11.7 g/dL — ABNORMAL LOW (ref 12.0–15.0)
Immature Granulocytes: 1 %
Lymphocytes Relative: 21 %
Lymphs Abs: 0.9 10*3/uL (ref 0.7–4.0)
MCH: 23.3 pg — ABNORMAL LOW (ref 26.0–34.0)
MCHC: 29.5 g/dL — ABNORMAL LOW (ref 30.0–36.0)
MCV: 78.9 fL — ABNORMAL LOW (ref 80.0–100.0)
Monocytes Absolute: 0.5 10*3/uL (ref 0.1–1.0)
Monocytes Relative: 11 %
Neutro Abs: 2.9 10*3/uL (ref 1.7–7.7)
Neutrophils Relative %: 66 %
Platelets: 243 10*3/uL (ref 150–400)
RBC: 5.02 MIL/uL (ref 3.87–5.11)
RDW: 13.5 % (ref 11.5–15.5)
WBC: 4.4 10*3/uL (ref 4.0–10.5)
nRBC: 0 % (ref 0.0–0.2)

## 2019-11-03 LAB — URINALYSIS, MICROSCOPIC (REFLEX): WBC, UA: NONE SEEN WBC/hpf (ref 0–5)

## 2019-11-03 LAB — SARS CORONAVIRUS 2 AG (30 MIN TAT): SARS Coronavirus 2 Ag: POSITIVE — AB

## 2019-11-03 LAB — LACTIC ACID, PLASMA
Lactic Acid, Venous: 2.3 mmol/L (ref 0.5–1.9)
Lactic Acid, Venous: 1.4 mmol/L (ref 0.5–1.9)

## 2019-11-03 MED ORDER — SODIUM CHLORIDE 0.9 % IV BOLUS
1000.0000 mL | Freq: Once | INTRAVENOUS | Status: AC
  Administered 2019-11-03: 1000 mL via INTRAVENOUS

## 2019-11-03 MED ORDER — ACETAMINOPHEN 325 MG PO TABS
650.0000 mg | ORAL_TABLET | Freq: Once | ORAL | Status: AC
  Administered 2019-11-03: 15:00:00 650 mg via ORAL
  Filled 2019-11-03: qty 2

## 2019-11-03 MED ORDER — SODIUM CHLORIDE 0.9 % IV BOLUS: 500.0000 mL | Freq: Once | INTRAVENOUS | Status: DC

## 2019-11-03 NOTE — ED Notes (Signed)
Date and time results received: 11/03/19 1617   Test: lactic acid Critical Value: 2.3  Name of Provider Notified: Little Orders Received? Or Actions Taken?: no orders given

## 2019-11-03 NOTE — ED Notes (Signed)
ED Provider at bedside. 

## 2019-11-03 NOTE — ED Triage Notes (Signed)
Arrived via EMS with loss of taste and body aches today. VS per EMS Temp 101.5, P 90, R 20, BP 144/88, FSBS 268

## 2019-11-03 NOTE — ED Provider Notes (Signed)
[facility]ERGENC[facility] DEPARTMENT Provider Note   CSN: [id number] Arrival date & time: 11/03/19  1509     History Chief Complaint  Patient presents with  . COVID symptoms    Autumn Barton is a 73 y.o. female.  73yo F w/ PMH including asthma, T2DM who p/w body aches and chills.  Patient states that yesterday she began having body aches, chills, and loss of taste.  Symptoms have continued today.  She is not sure whether she has had any fevers.  No cough or sore throat.  No vomiting or diarrhea.  She denies any breathing problems.  She has noticed that her blood pressure and blood sugar have gone up.  No sick contacts.  No urinary symptoms.  Of note, patient had revision lumbar fusion surgery on 12/15 and states she has been recovering well since surgery. No problems at surgical site. She has had pain since surgery but this hasn't escalated/worsened recently.  The history is provided by the patient.       Past Medical History:  Diagnosis Date  . Asthma   . Diabetes mellitus     Patient Active Problem List   Diagnosis Date Noted  . Diabetes mellitus     Past Surgical History:  Procedure Laterality Date  . COSMETIC SURGERY    . TOTAL KNEE ARTHROPLASTY       OB History   No obstetric history on file.     History reviewed. No pertinent family history.  Social History   Tobacco Use  . Smoking status: Never Smoker  . Smokeless tobacco: Never Used  Substance Use Topics  . Alcohol use: No  . Drug use: No    Home Medications Prior to Admission medications   Medication Sig Start Date End Date Taking? Authorizing Provider  albuterol (PROVENTIL HFA;VENTOLIN HFA) 108 (90 Base) MCG/ACT inhaler Inhale into the lungs.    [provider]  alendronate (FOSAMAX) 35 MG tablet Take by mouth. 11/03/18   [provider]  diazepam (VALIUM) 5 MG tablet Take 1 tablet 30 minutes before procedure 10/22/16   [provider]  gabapentin (NEURONTIN)  100 MG capsule Take 1-3 capsules at bedtime as needed for leg pain. 10/22/16   [provider]  lisinopril (PRINIVIL,ZESTRIL) 10 MG tablet Take by mouth. 08/27/18   [provider]  metFORMIN (GLUMETZA) 1000 MG (MOD) 24 hr tablet Take 1,000 mg by mouth once.     [provider]  pravastatin (PRAVACHOL) 40 MG tablet TAKE 1 TABLET BY MOUTH DAILY 02/10/17   [provider]  Vitamin D, Ergocalciferol, (DRISDOL) 1.25 MG (50000 UT) CAPS capsule Take by mouth. 09/23/16   [provider]    Allergies    Aspirin and Ibuprofen  Review of Systems   Review of Systems All other systems reviewed and are negative except that which was mentioned in HPI  Physical Exam Updated Vital Signs BP 123/79 (BP Location: Right Arm)   Pulse 90   Temp 99.5 F (37.5 C) (Oral)   Resp (!) 26   Ht 5\' 3"  (1.6 m)   Wt 108.4 kg   SpO2 95%   BMI 42.34 kg/m   Physical Exam Vitals and nursing note reviewed.  Constitutional:      General: She is not in acute distress.    Appearance: She is well-developed.  HENT:     Head: Normocephalic and atraumatic.  Eyes:     Conjunctiva/sclera: Conjunctivae normal.  Cardiovascular:     Rate and  Rhythm: Normal rate and regular rhythm.     Heart sounds: Normal heart sounds. No murmur.  Pulmonary:     Effort: Pulmonary effort is normal.     Breath sounds: Normal breath sounds.  Abdominal:     General: Bowel sounds are normal. There is no distension.     Palpations: Abdomen is soft.     Tenderness: There is no abdominal tenderness.  Musculoskeletal:     Cervical back: Neck supple.  Skin:    General: Skin is warm and dry.     Comments: Healed incision site on lumbar back clean/dry/intact, no surrounding erythema  Neurological:     Mental Status: She is alert and oriented to person, place, and time.     Comments: Fluent speech  Psychiatric:        Judgment: Judgment normal.     ED Results / Procedures / Treatments    Labs (all labs ordered are listed, but only abnormal results are displayed) Labs Reviewed  SARS CORONAVIRUS 2 AG (30 MIN TAT) - Abnormal; Notable for the following components:      Result Value   SARS Coronavirus 2 Ag POSITIVE (*)    All other components within normal limits  COMPREHENSIVE METABOLIC PANEL - Abnormal; Notable for the following components:   Glucose, Bld 163 (*)    Creatinine, Ser 1.16 (*)    Calcium 8.2 (*)    Albumin 3.3 (*)    Total Bilirubin 0.2 (*)    GFR calc non Af Amer 46 (*)    GFR calc Af Amer 53 (*)    All other components within normal limits  CBC WITH DIFFERENTIAL/PLATELET - Abnormal; Notable for the following components:   Hemoglobin 11.7 (*)    MCV 78.9 (*)    MCH 23.3 (*)    MCHC 29.5 (*)    All other components within normal limits  URINALYSIS, ROUTINE W REFLEX MICROSCOPIC - Abnormal; Notable for the following components:   Specific Gravity, Urine >1.030 (*)    Hgb urine dipstick TRACE (*)    Protein, ur 30 (*)    All other components within normal limits  LACTIC ACID, PLASMA - Abnormal; Notable for the following components:   Lactic Acid, Venous 2.3 (*)    All other components within normal limits  URINALYSIS, MICROSCOPIC (REFLEX) - Abnormal; Notable for the following components:   Bacteria, UA FEW (*)    All other components within normal limits  URINE CULTURE  LACTIC ACID, PLASMA    EKG None  Radiology DG Chest Port 1 View  Result Date: 11/03/2019 CLINICAL DATA:  Fever and chills. EXAM: PORTABLE CHEST 1 VIEW COMPARISON:  Chest x-ray dated 05/27/2018 FINDINGS: The heart size and mediastinal contours are within normal limits. Both lungs are clear. The visualized skeletal structures are unremarkable. IMPRESSION: No active disease. Electronically Signed   By: Francene Boyers M.D.   On: 11/03/2019 16:34    Procedures Procedures (including critical care time)  Medications Ordered in ED Medications  acetaminophen (TYLENOL) tablet 650 mg  (650 mg Oral Given 11/03/19 1525)  sodium chloride 0.9 % bolus 1,000 mL ( Intravenous Rate/Dose Change 11/03/19 1727)    ED Course  I have reviewed the triage vital signs and the nursing notes.  Pertinent labs & imaging results that were available during my care of the patient were reviewed by me and considered in my medical decision making (see chart for details).    MDM Rules/Calculators/A&P  Pt comfortable on exam, normal WOB. T 102.1, O2 sat 85-97% on RA, normal HR. No signs of infection at surgery site.  Lab work shows lactate 2.3, Covid antigen test positive, UA without evidence of infection, normal WBC count, Cr 1.16. CXR clear.   Patient has remained in the high 90s on room air with no evidence of respiratory compromise.  She also has no signs/symptoms of severe dehydration.  Have given a small fluid bolus.  I have discussed COVID-19 infection with her, need for close PCP follow-up, and need to have family members tested.  Also discussed the importance of strict quarantine per CDC guidelines.  I have extensively reviewed return precautions with her and she voiced understanding.  Patient feels comfortable with discharge.   Autumn Barton was evaluated in Emergency Department on 11/03/2019 for the symptoms described in the history of present illness. She was evaluated in the context of the global COVID-19 pandemic, which necessitated consideration that the patient might be at risk for infection with the SARS-CoV-2 virus that causes COVID-19. Institutional protocols and algorithms that pertain to the evaluation of patients at risk for COVID-19 are in a state of rapid change based on information released by regulatory bodies including the CDC and federal and state organizations. These policies and algorithms were followed during the patient's care in the ED.  Final Clinical Impression(s) / ED Diagnoses Final diagnoses:  COVID-19 virus infection  Fever, unspecified  fever cause    Rx / DC Orders ED Discharge Orders    None       Maritssa Haughton, Ambrose Finlandachel Morgan, MD 11/03/19 1730

## 2019-11-04 ENCOUNTER — Telehealth: Payer: Self-pay | Admitting: Unknown Physician Specialty

## 2019-11-04 NOTE — Telephone Encounter (Signed)
Called to discuss with patient about Covid symptoms and the use of bamlanivimab, a monoclonal antibody infusion for those with mild to moderate Covid symptoms and at a high risk of hospitalization.  Pt is qualified for this infusion at the [facility] due to Age > 65   Message left to call back  

## 2019-11-05 LAB — URINE CULTURE: Culture: 80000 — AB

## 2019-11-06 ENCOUNTER — Telehealth: Payer: Self-pay

## 2019-11-06 NOTE — Progress Notes (Signed)
ED Antimicrobial Stewardship Positive Culture Follow Up   Autumn Barton is an 73 y.o. female who presented to [facility] on 11/03/2019 with a chief complaint of  Chief Complaint  Patient presents with   COVID symptoms    Recent Results (from the past 720 hour(s))  Urine culture     Status: Abnormal   Collection Time: 11/03/19  3:32 PM   Specimen: Urine, Clean Catch  Result Value Ref Range Status   Specimen Description   Final    URINE, CLEAN CATCH Performed at [facility], [address] [place], [place] [address]    Special Requests   Final    NONE Performed at [facility], [address] [place], [place] [address]    Culture 80,000 COLONIES/mL PROTEUS MIRABILIS (A)  Final   Report Status 11/05/2019 FINAL  Final   Organism ID, Bacteria PROTEUS MIRABILIS (A)  Final      Susceptibility   Proteus mirabilis - MIC*    AMPICILLIN <=2 SENSITIVE Sensitive     CEFAZOLIN <=4 SENSITIVE Sensitive     CEFTRIAXONE <=1 SENSITIVE Sensitive     CIPROFLOXACIN <=0.25 SENSITIVE Sensitive     GENTAMICIN <=1 SENSITIVE Sensitive     IMIPENEM 4 SENSITIVE Sensitive     NITROFURANTOIN 128 RESISTANT Resistant     TRIMETH/SULFA <=20 SENSITIVE Sensitive     AMPICILLIN/SULBACTAM <=2 SENSITIVE Sensitive     PIP/TAZO <=4 SENSITIVE Sensitive     * 80,000 COLONIES/mL PROTEUS MIRABILIS  SARS Coronavirus 2 Ag (30 min TAT) - Nasal Swab (BD Veritor Kit)     Status: Abnormal   Collection Time: 11/03/19  3:32 PM   Specimen: Nasal Swab (BD Veritor Kit)  Result Value Ref Range Status   SARS Coronavirus 2 Ag POSITIVE (A) NEGATIVE Final    Comment: RESULT CALLED TO, READ BACK BY AND VERIFIED WITH: Jovita Kussmaul RN '@[id number]'$  11/03/2019 OLSONM (NOTE) SARS-CoV-2 antigen PRESENT. Positive results indicate the presence of viral antigens, but clinical correlation with patient history and other diagnostic information is necessary to determine patient infection status.  Positive results do  not rule out bacterial infection or co-infection  with other viruses. False positive results are rare but can occur, and confirmatory RT-PCR testing may be appropriate in some circumstances. The expected result is Negative. Fact Sheet for Patients: PodPark.tn Fact Sheet for Providers: GiftContent.is  This test is not yet approved or cleared by the [place] FDA and  has been authorized for detection and/or diagnosis of SARS-CoV-2 by FDA under an Emergency Use Authorization (EUA).  This EUA will remain in effect (meaning this test can be used) for the duration of  the COVID-19  declaration under Section 564(b)(1) of the Act, 21 U.S.C. section 360bbb-3(b)(1), unless the authorization is terminated or revoked sooner. Performed at [facility], [address] [place], [place] [address]     '[x]'$  Patient discharged originally without antimicrobial agent  New antibiotic prescription: No further treatment indicated for asymptomatic bacteriuria.  ED Provider: Benedetto Goad, PA-C   Elicia Lamp P 11/06/2019, 8:06 AM Clinical Pharmacist Monday - Friday phone -  [phone] Saturday - Sunday phone - [phone]

## 2019-11-06 NOTE — Telephone Encounter (Signed)
No treatment for [facility] ED 11/02/2021 per Benedetto Goad PA

## 2019-11-07 ENCOUNTER — Emergency Department (HOSPITAL_BASED_OUTPATIENT_CLINIC_OR_DEPARTMENT_OTHER)
Admission: EM | Admit: 2019-11-07 | Discharge: 2019-11-07 | Disposition: A | Discharge status: Home / Self Care | Payer: Medicare HMO | Attending: Emergency Medicine | Admitting: Emergency Medicine

## 2019-11-07 ENCOUNTER — Other Ambulatory Visit: Payer: Self-pay

## 2019-11-07 ENCOUNTER — Encounter (HOSPITAL_BASED_OUTPATIENT_CLINIC_OR_DEPARTMENT_OTHER): Payer: Self-pay | Admitting: *Deleted

## 2019-11-07 DIAGNOSIS — Z7984 Long term (current) use of oral hypoglycemic drugs: Secondary | ICD-10-CM | POA: Insufficient documentation

## 2019-11-07 DIAGNOSIS — U071 COVID-19: Secondary | ICD-10-CM | POA: Insufficient documentation

## 2019-11-07 DIAGNOSIS — R438 Other disturbances of smell and taste: Secondary | ICD-10-CM | POA: Insufficient documentation

## 2019-11-07 DIAGNOSIS — Z79899 Other long term (current) drug therapy: Secondary | ICD-10-CM | POA: Insufficient documentation

## 2019-11-07 DIAGNOSIS — E119 Type 2 diabetes mellitus without complications: Secondary | ICD-10-CM | POA: Diagnosis not present

## 2019-11-07 DIAGNOSIS — R509 Fever, unspecified: Secondary | ICD-10-CM | POA: Diagnosis present

## 2019-11-07 DIAGNOSIS — Z96659 Presence of unspecified artificial knee joint: Secondary | ICD-10-CM | POA: Diagnosis not present

## 2019-11-07 DIAGNOSIS — I10 Essential (primary) hypertension: Secondary | ICD-10-CM | POA: Diagnosis not present

## 2019-11-07 DIAGNOSIS — R5081 Fever presenting with conditions classified elsewhere: Secondary | ICD-10-CM

## 2019-11-07 DIAGNOSIS — M7918 Myalgia, other site: Secondary | ICD-10-CM | POA: Diagnosis not present

## 2019-11-07 NOTE — ED Triage Notes (Signed)
Intermittent fever since Saturday.  Was here today for the same complaint.

## 2019-11-07 NOTE — Discharge Instructions (Addendum)
Please continue taking Tylenol every 4-6 hours for fever. Continue monitoring your fever at home with a thermometer. It is expected for you to have fevers with your COVID 19 infection.   You can buy a pulse oximeter and monitor your oxygen saturations at home if you begin feeling short of breath or have difficulty breathing. If you oxygen saturation remains around 83% then you need to return to the ED for further evaluation.   Please follow up with your PCP regarding your COVID 19 status. Continue self isolating at home during your quarantine period.      Person Under Monitoring Name: Autumn Barton  Location[place] [address] [place] [id number]   Infection Prevention Recommendations for Individuals Confirmed to have, or Being Evaluated for, 2019 Novel Coronavirus (COVID-19) Infection Who Receive Care at Home  Individuals who are confirmed to have, or are being evaluated for, COVID-19 should follow the prevention steps below until a healthcare provider or local or state health department says they can return to normal activities.  Stay home except to get medical care You should restrict activities outside your home, except for getting medical care. Do not go to work, school, or public areas, and do not use public transportation or taxis.  Call ahead before visiting your doctor Before your medical appointment, call the healthcare provider and tell them that you have, or are being evaluated for, COVID-19 infection. This will help the healthcare provider's office take steps to keep other people from getting infected. Ask your healthcare provider to call the local or state health department.  Monitor your symptoms Seek prompt medical attention if your illness is worsening (e.g., difficulty breathing). Before going to your medical appointment, call the healthcare provider and tell them that you have, or are being evaluated for, COVID-19 infection. Ask your healthcare provider to call  the local or state health department.  Wear a facemask You should wear a facemask that covers your nose and mouth when you are in the same room with other people and when you visit a healthcare provider. People who live with or visit you should also wear a facemask while they are in the same room with you.  Separate yourself from other people in your home As much as possible, you should stay in a different room from other people in your home. Also, you should use a separate bathroom, if available.  Avoid sharing household items You should not share dishes, drinking glasses, cups, eating utensils, towels, bedding, or other items with other people in your home. After using these items, you should wash them thoroughly with soap and water.  Cover your coughs and sneezes Cover your mouth and nose with a tissue when you cough or sneeze, or you can cough or sneeze into your sleeve. Throw used tissues in a lined trash can, and immediately wash your hands with soap and water for at least 20 seconds or use an alcohol-based hand rub.  Wash your [employer] Pacific Corporationhands Wash your hands often and thoroughly with soap and water for at least 20 seconds. You can use an alcohol-based hand sanitizer if soap and water are not available and if your hands are not visibly dirty. Avoid touching your eyes, nose, and mouth with unwashed hands.   Prevention Steps for Caregivers and Household Members of Individuals Confirmed to have, or Being Evaluated for, COVID-19 Infection Being Cared for in the Home  If you live with, or provide care at home for, a person confirmed to have, or being  evaluated for, COVID-19 infection please follow these guidelines to prevent infection:  Follow healthcare provider's instructions Make sure that you understand and can help the patient follow any healthcare provider instructions for all care.  Provide for the patient's basic needs You should help the patient with basic needs in the home and  provide support for getting groceries, prescriptions, and other personal needs.  Monitor the patient's symptoms If they are getting sicker, call his or her medical provider and tell them that the patient has, or is being evaluated for, COVID-19 infection. This will help the healthcare provider's office take steps to keep other people from getting infected. Ask the healthcare provider to call the local or state health department.  Limit the number of people who have contact with the patient If possible, have only one caregiver for the patient. Other household members should stay in another home or place of residence. If this is not possible, they should stay in another room, or be separated from the patient as much as possible. Use a separate bathroom, if available. Restrict visitors who do not have an essential need to be in the home.  Keep older adults, very young children, and other sick people away from the patient Keep older adults, very young children, and those who have compromised immune systems or chronic health conditions away from the patient. This includes people with chronic heart, lung, or kidney conditions, diabetes, and cancer.  Ensure good ventilation Make sure that shared spaces in the home have good air flow, such as from an air conditioner or an opened window, weather permitting.  Wash your hands often Wash your hands often and thoroughly with soap and water for at least 20 seconds. You can use an alcohol based hand sanitizer if soap and water are not available and if your hands are not visibly dirty. Avoid touching your eyes, nose, and mouth with unwashed hands. Use disposable paper towels to dry your hands. If not available, use dedicated cloth towels and replace them when they become wet.  Wear a facemask and gloves Wear a disposable facemask at all times in the room and gloves when you touch or have contact with the patient's blood, body fluids, and/or secretions or  excretions, such as sweat, saliva, sputum, nasal mucus, vomit, urine, or feces.  Ensure the mask fits over your nose and mouth tightly, and do not touch it during use. Throw out disposable facemasks and gloves after using them. Do not reuse. Wash your hands immediately after removing your facemask and gloves. If your personal clothing becomes contaminated, carefully remove clothing and launder. Wash your hands after handling contaminated clothing. Place all used disposable facemasks, gloves, and other waste in a lined container before disposing them with other household waste. Remove gloves and wash your hands immediately after handling these items.  Do not share dishes, glasses, or other household items with the patient Avoid sharing household items. You should not share dishes, drinking glasses, cups, eating utensils, towels, bedding, or other items with a patient who is confirmed to have, or being evaluated for, COVID-19 infection. After the person uses these items, you should wash them thoroughly with soap and water.  Wash laundry thoroughly Immediately remove and wash clothes or bedding that have blood, body fluids, and/or secretions or excretions, such as sweat, saliva, sputum, nasal mucus, vomit, urine, or feces, on them. Wear gloves when handling laundry from the patient. Read and follow directions on labels of laundry or clothing items and detergent. In general,  wash and dry with the warmest temperatures recommended on the label.  Clean all areas the individual has used often Clean all touchable surfaces, such as counters, tabletops, doorknobs, bathroom fixtures, toilets, phones, keyboards, tablets, and bedside tables, every day. Also, clean any surfaces that may have blood, body fluids, and/or secretions or excretions on them. Wear gloves when cleaning surfaces the patient has come in contact with. Use a diluted bleach solution (e.g., dilute bleach with 1 part bleach and 10 parts water)  or a household disinfectant with a label that says EPA-registered for coronaviruses. To make a bleach solution at home, add 1 tablespoon of bleach to 1 quart (4 cups) of water. For a larger supply, add  cup of bleach to 1 gallon (16 cups) of water. Read labels of cleaning products and follow recommendations provided on product labels. Labels contain instructions for safe and effective use of the cleaning product including precautions you should take when applying the product, such as wearing gloves or eye protection and making sure you have good ventilation during use of the product. Remove gloves and wash hands immediately after cleaning.  Monitor yourself for signs and symptoms of illness Caregivers and household members are considered close contacts, should monitor their health, and will be asked to limit movement outside of the home to the extent possible. Follow the monitoring steps for close contacts listed on the symptom monitoring form.   ? If you have additional questions, contact your local health department or call the epidemiologist on call at [phone] (available 24/7). ? This guidance is subject to change. For the most up-to-date guidance from [facility], please refer to their website: TripMetro.hu

## 2019-11-07 NOTE — ED Notes (Signed)
ED Provider at bedside. 

## 2019-11-07 NOTE — ED Provider Notes (Signed)
[facility] EMERGENCY DEPARTMENT Provider Note   CSN: [id number] Arrival date & time: 11/07/19  1211     History Chief Complaint  Patient presents with  . Fever    Autumn Barton is a 73 y.o. female with PMHx asthma and diabetes who presents to the ED today with complaint of continued fever.  Patient was seen in the ED on 12/26 with complaint of fever, chills, body aches, loss of taste.  She had tested positive for COVID-19 at that time and was discharged home as she had no other symptoms, chest x-ray was clear, her oxygen saturations remained above normal.  She reports that she has been taking Tylenol at home for her fever but her fever continues to return.  Ports she last took Tylenol last night prior to going to sleep.  She woke up this morning and her home health aide came to check on her and had her temperature checked and she reports it was 100.1.  She reports that this concerned her.  We rechecked her temperature shortly afterwards and it had gone up to 100.8.  Patient had not taken any Tylenol since last night.  She reports she did take Tylenol prior to coming to the ED.  Her temperature in the ED 99.9.  Patient has no other complaints currently.  She denies any chest pain, shortness of breath, cough, diarrhea.  She states that she is concerned as she recently lost a cousin who lives in [place] due to COVID-19 infection.  She states that she wants something to take her fever away completely.   The history is provided by the patient and medical records.       Past Medical History:  Diagnosis Date  . Asthma   . Diabetes mellitus     Patient Active Problem List   Diagnosis Date Noted  . Diabetes mellitus     Past Surgical History:  Procedure Laterality Date  . COSMETIC SURGERY    . TOTAL KNEE ARTHROPLASTY       OB History   No obstetric history on file.     No family history on file.  Social History   Tobacco Use  . Smoking status: Never Smoker  .  Smokeless tobacco: Never Used  Substance Use Topics  . Alcohol use: No  . Drug use: No    Home Medications Prior to Admission medications   Medication Sig Start Date End Date Taking? Authorizing Provider  albuterol (PROVENTIL HFA;VENTOLIN HFA) 108 (90 Base) MCG/ACT inhaler Inhale into the lungs.   Yes [provider]  alendronate (FOSAMAX) 35 MG tablet Take by mouth. 11/03/18  Yes [provider]  diazepam (VALIUM) 5 MG tablet Take 1 tablet 30 minutes before procedure 10/22/16  Yes [provider]  gabapentin (NEURONTIN) 100 MG capsule Take 1-3 capsules at bedtime as needed for leg pain. 10/22/16  Yes [provider]  lisinopril (PRINIVIL,ZESTRIL) 10 MG tablet Take by mouth. 08/27/18  Yes [provider]  metFORMIN (GLUMETZA) 1000 MG (MOD) 24 hr tablet Take 1,000 mg by mouth once.    Yes [provider]  pravastatin (PRAVACHOL) 40 MG tablet TAKE 1 TABLET BY MOUTH DAILY 02/10/17  Yes [provider]  Vitamin D, Ergocalciferol, (DRISDOL) 1.25 MG (50000 UT) CAPS capsule Take by mouth. 09/23/16  Yes [provider]    Allergies    Aspirin and Ibuprofen  Review of Systems   Review of Systems  Constitutional: Positive for fever.  Respiratory: Negative for cough and  shortness of breath.   Cardiovascular: Negative for chest pain.  Gastrointestinal: Negative for diarrhea.  All other systems reviewed and are negative.   Physical Exam Updated Vital Signs BP 118/72 (BP Location: Right Arm)   Pulse 91   Temp 99.9 F (37.7 C) (Oral)   Resp 18   Ht 5\' 3"  (1.6 m)   Wt 108.4 kg   SpO2 93%   BMI 42.34 kg/m   Physical Exam Vitals and nursing note reviewed.  Constitutional:      Appearance: She is not ill-appearing or diaphoretic.  HENT:     Head: Normocephalic and atraumatic.  Eyes:     Conjunctiva/sclera: Conjunctivae normal.  Cardiovascular:     Rate and Rhythm: Normal rate and regular rhythm.     Pulses:  Normal pulses.  Pulmonary:     Effort: Pulmonary effort is normal.     Breath sounds: Normal breath sounds. No wheezing, rhonchi or rales.     Comments: No accessory muscle use. Speaking in full sentences without difficulty. Satting 94-95% on RA at rest. No coughing. LCTAB.  Abdominal:     Palpations: Abdomen is soft.     Tenderness: There is no abdominal tenderness.  Musculoskeletal:     Cervical back: Neck supple.  Skin:    General: Skin is warm and dry.  Neurological:     Mental Status: She is alert.     ED Results / Procedures / Treatments   Labs (all labs ordered are listed, but only abnormal results are displayed) Labs Reviewed - No data to display  EKG None  Radiology No results found.  Procedures Procedures (including critical care time)  Medications Ordered in ED Medications - No data to display  ED Course  I have reviewed the triage vital signs and the nursing notes.  Pertinent labs & imaging results that were available during my care of the patient were reviewed by me and considered in my medical decision making (see chart for details).  73 year old female presents the ED today with complaint of continued fever.  Was diagnosed with COVID-19 on 12/26.  States that she is returning today as her fever has continued to return despite taking Tylenol.  I had lengthy discussion with patient regarding the fact that a fever is expected with her COVID-19 diagnosis.  She is having no other symptoms currently.  No cough no shortness of breath no chest pain no diarrhea.  She had a full work-up done on 12/26 without any other acute findings.  Her chest x-ray was clear at that point.  Her vitals are stable today.  Her temp is 99.9, she had just taken Tylenol prior to arrival.  She is nontachycardic and nontachypneic.  She is satting 94 to 95% at rest.  Does appear she is overtly concerned given that her cousin recently passed away from COVID-19.  It does appear he was having  respiratory symptoms.  Was in the room with patient for a lengthy period of time regarding her fevers.  I advised that she continue monitoring her fevers at home and to take Tylenol as needed for her symptoms.  She is not to exceed 3 g of Tylenol per day.  Patient then called her daughter on speaker phone and I had talked to her about her symptoms as well.  It does appear she is doing well when she is approximately day 5 of her symptoms and she has no other symptoms besides fever.  I have advised that she can buy a  pulse ox and monitor her oxygen saturation at home if she starts feeling short of breath.  She is advised to return to the ED if consistently below 85%.  Advised to follow-up with her PCP otherwise.  Advised to continue to self quarantine and monitor her symptoms at home.  Patient is in agreement with plan at this time.  She is stable for discharge.  I have discussed case with attending physician Dr. Renaye Rakersrifan on who agrees with plan.   This note was prepared using Dragon voice recognition software and may include unintentional dictation errors due to the inherent limitations of voice recognition software.  Autumn Barton was evaluated in Emergency Department on 11/07/2019 for the symptoms described in the history of present illness. She was evaluated in the context of the global COVID-19 pandemic, which necessitated consideration that the patient might be at risk for infection with the SARS-CoV-2 virus that causes COVID-19. Institutional protocols and algorithms that pertain to the evaluation of patients at risk for COVID-19 are in a state of rapid change based on information released by regulatory bodies including the CDC and federal and state organizations. These policies and algorithms were followed during the patient's care in the ED.      MDM Rules/Calculators/A&P                       Final Clinical Impression(s) / ED Diagnoses Final diagnoses:  COVID-19  Fever in other diseases     Rx / DC Orders ED Discharge Orders    None       Tanda RockersVenter, Jeily Guthridge, PA-C 11/07/19 1320    Terald Sleeperrifan, Matthew J, MD 11/07/19 [phone]

## 2020-01-07 ENCOUNTER — Ambulatory Visit: Payer: Medicare HMO | Attending: Internal Medicine

## 2020-01-07 DIAGNOSIS — Z23 Encounter for immunization: Secondary | ICD-10-CM | POA: Insufficient documentation

## 2020-01-07 NOTE — Progress Notes (Signed)
   Covid-19 Vaccination Clinic  Name:  Autumn Barton    MRN: [id number] DOB: [birth year]  01/07/2020  [name] was observed post Covid-19 immunization for 15 minutes without incidence. She was provided with Vaccine Information Sheet and instruction to access the V-Safe system.   [name] was instructed to call 911 with any severe reactions post vaccine: Marland Kitchen Difficulty breathing  . Swelling of your face and throat  . A fast heartbeat  . A bad rash all over your body  . Dizziness and weakness    Immunizations Administered    Name Date Dose VIS Date Route   Pfizer COVID-19 Vaccine 01/07/2020 10:20 AM 0.3 mL 10/19/2019 Intramuscular   Manufacturer: [employer], [employer]   Lot: [id number]   NDC: [address]-3

## 2020-02-05 ENCOUNTER — Ambulatory Visit: Payer: Medicare HMO | Attending: Internal Medicine

## 2020-02-05 DIAGNOSIS — Z23 Encounter for immunization: Secondary | ICD-10-CM

## 2020-02-05 NOTE — Progress Notes (Signed)
   Covid-19 Vaccination Clinic  Name:  Autumn Barton    MRN: [id number] DOB: August 01, 1946  02/05/2020  Autumn Barton was observed post Covid-19 immunization for 15 minutes without incident. She was provided with Vaccine Information Sheet and instruction to access the V-Safe system.   Autumn Barton was instructed to call 911 with any severe reactions post vaccine: Marland Kitchen Difficulty breathing  . Swelling of face and throat  . A fast heartbeat  . A bad rash all over body  . Dizziness and weakness   Immunizations Administered    Name Date Dose VIS Date Route   Pfizer COVID-19 Vaccine 02/05/2020 10:04 AM 0.3 mL 10/19/2019 Intramuscular   Manufacturer: [employer], [employer]   Lot: [id number]   NDC: [address]-6

## 2020-05-08 ENCOUNTER — Ambulatory Visit (INDEPENDENT_AMBULATORY_CARE_PROVIDER_SITE_OTHER): Payer: Medicare HMO | Admitting: Allergy and Immunology

## 2020-05-08 ENCOUNTER — Encounter: Payer: Self-pay | Admitting: Allergy and Immunology

## 2020-05-08 ENCOUNTER — Other Ambulatory Visit: Payer: Self-pay

## 2020-05-08 VITALS — BP 128/78 | HR 86 | Temp 98.1°F | Resp 20 | Ht 62.52 in | Wt 221.8 lb

## 2020-05-08 DIAGNOSIS — H1010 Acute atopic conjunctivitis, unspecified eye: Secondary | ICD-10-CM | POA: Insufficient documentation

## 2020-05-08 DIAGNOSIS — H1013 Acute atopic conjunctivitis, bilateral: Secondary | ICD-10-CM | POA: Diagnosis not present

## 2020-05-08 DIAGNOSIS — J3089 Other allergic rhinitis: Secondary | ICD-10-CM | POA: Insufficient documentation

## 2020-05-08 DIAGNOSIS — E1169 Type 2 diabetes mellitus with other specified complication: Secondary | ICD-10-CM | POA: Diagnosis not present

## 2020-05-08 DIAGNOSIS — J4540 Moderate persistent asthma, uncomplicated: Secondary | ICD-10-CM

## 2020-05-08 MED ORDER — BUDESONIDE-FORMOTEROL FUMARATE 160-4.5 MCG/ACT IN AERO: 2.0000 | INHALATION_SPRAY | Freq: Two times a day (BID) | RESPIRATORY_TRACT | 5 refills | Status: DC

## 2020-05-08 MED ORDER — ALBUTEROL SULFATE HFA 108 (90 BASE) MCG/ACT IN AERS: INHALATION_SPRAY | RESPIRATORY_TRACT | 1 refills | Status: DC

## 2020-05-08 MED ORDER — OLOPATADINE HCL 0.2 % OP SOLN: 1.0000 [drp] | Freq: Every day | OPHTHALMIC | 5 refills | Status: DC | PRN

## 2020-05-08 MED ORDER — AZELASTINE & FLUTICASONE 137 & 50 MCG/ACT NA THPK: 1.0000 | PACK | Freq: Two times a day (BID) | NASAL | 5 refills | Status: DC | PRN

## 2020-05-08 NOTE — Progress Notes (Signed)
New Patient Note  RE: Autumn Barton MRN: [id number] DOB: [birth year] Date of Office Visit: 05/08/2020  Referring provider: No ref. provider found Primary care provider: Milta Deiters, FNP  Chief Complaint: Allergic Rhinitis  and Asthma   History of present illness: Autumn Barton is a 74 y.o. female presenting today for evaluation of asthma and rhinoconjunctivitis.  She reports that she was diagnosed with asthma in her 10s because she had worked in [employer].  The asthma had been relatively stable over the years, however in late December 2020 she was diagnosed with Covid 19 requiring a 2-week hospitalization.  Since that time, she has had increased dyspnea with occasional wheezing despite compliance with montelukast 10 mg daily at bedtime.  She reports significant dyspnea with mild exertion.  She states that she is unable to climb 1 flight of stairs and gets short of breath, requiring a rest, while walking through the house from her bedroom to the kitchen.  She experiences nocturnal awakenings due to lower respiratory symptoms 4 times per month on average. Autumn Barton experiences nasal congestion, rhinorrhea, postnasal drainage, ocular pruritus, lacrimation, and frontal sinus pressure.  No significant seasonal symptom variation has been noted nor have specific environmental triggers been identified.  She attempts to control the symptoms with over-the-counter antihistamines and fluticasone nasal spray.  Assessment and plan: Moderate persistent asthma Currently with suboptimal control.  In addition, todays spirometry results, assessed while asymptomatic, suggest under-perception of bronchoconstriction.  A prescription has been provided for Symbicort (budesonide/formoterol) 160/4.5 g, 2 inhalations twice a day. To maximize pulmonary deposition, a spacer has been provided along with instructions for its proper administration with an HFA inhaler.  Continue montelukast 10 mg daily at  bedtime.  Continue albuterol HFA, 1 to 2 inhalations every 4-6 hours if needed.  I have also recommended using albuterol 10 to 15 minutes prior to exertion/exercise.  To hasten symptom relief, prednisone has been provided, 20 mg x 4 days, 10 mg x1 day, then stop.  The patient has been asked to contact me if her symptoms persist or progress. Otherwise, she may return for follow up in 6 weeks.  Diabetes mellitus (HCC)  The patient has been asked to carefully monitor blood glucose levels while on prednisone.  She has verbalized understanding and has agreed to do so.  Perennial allergic rhinitis  Aeroallergen avoidance measures have been discussed and provided in written form.  A prescription has been provided for azelastine/fluticasone nasal spray, 1 spray per nostril twice daily as needed. Proper nasal spray technique has been discussed and demonstrated.  Nasal saline spray (i.e. Simply Saline) is recommended prior to medicated nasal sprays and as needed.  Allergic conjunctivitis  Treatment plan as outlined above for allergic rhinitis.  A prescription has been provided for generic Pataday, one drop per eye daily as needed.  If insurance does not cover this medication, medicated allergy eyedrops may be purchased over-the-counter as Art therapist.  I have also recommended eye lubricant drops (i.e., Natural Tears) as needed.   Meds ordered this encounter  Medications  . albuterol (VENTOLIN HFA) 108 (90 Base) MCG/ACT inhaler    Sig: 2 puffs every 4-6 hours as needed for coughing or wheezing spells. Use with spacer.    Dispense:  18 g    Refill:  1  . budesonide-formoterol (SYMBICORT) 160-4.5 MCG/ACT inhaler    Sig: Inhale 2 puffs into the lungs in the morning and at bedtime. Use with spacer. Rinse, gargle and spit out after  use.    Dispense:  1 Inhaler    Refill:  5  . Azelastine & Fluticasone 137 & 50 MCG/ACT THPK    Sig: Place 1 spray into the nose 2 (two)  times daily as needed.    Dispense:  1 each    Refill:  5  . Olopatadine HCl (PATADAY) 0.2 % SOLN    Sig: Apply 1 drop to eye daily as needed.    Dispense:  2.5 mL    Refill:  5    Diagnostics: Spirometry: FVC of 1.69 L and an FEV1 of 1.15 L (73% predicted) with significant (290 mL) postbronchodilator improvement.  This study was performed while the patient was asymptomatic.  Please see scanned spirometry results for details. Epicutaneous testing: Negative despite a positive histamine control. Intradermal testing: Positive to molds, cat hair, dog epithelia, cockroach antigen, and dust mite antigen.  Physical examination: Blood pressure 128/78, pulse 86, temperature 98.1 F (36.7 C), resp. rate 20, height 5' 2.52" (1.588 m), weight 221 lb 12.5 oz (100.6 kg), SpO2 100 %.  General: Alert, interactive, in no acute distress. HEENT: TMs pearly gray, turbinates moderately edematous without discharge, post-pharynx unremarkable. Neck: Supple without lymphadenopathy. Lungs: Mildly decreased breath sounds bilaterally without wheezing, rhonchi or rales. CV: Normal S1, S2 without murmurs. Abdomen: Nondistended, nontender. Skin: Warm and dry, without lesions or rashes. Extremities:  No clubbing, cyanosis or edema. Neuro:   Grossly intact.  Review of systems:  Review of systems negative except as noted in HPI / PMHx or noted below: Review of Systems  Constitutional: Negative.   HENT: Negative.   Eyes: Negative.   Respiratory: Negative.   Cardiovascular: Negative.   Gastrointestinal: Negative.   Genitourinary: Negative.   Musculoskeletal: Negative.   Skin: Negative.   Neurological: Negative.   Endo/Heme/Allergies: Negative.   Psychiatric/Behavioral: Negative.     Past medical history:  Past Medical History:  Diagnosis Date  . Asthma   . Diabetes mellitus     Past surgical history:  Past Surgical History:  Procedure Laterality Date  . BACK SURGERY  2020  . COSMETIC SURGERY     . TOTAL KNEE ARTHROPLASTY      Family history: Family History  Problem Relation Age of Onset  . Eczema Son     Social history: Social History   Socioeconomic History  . Marital status: Widowed    Spouse name: Not on file  . Number of children: Not on file  . Years of education: Not on file  . Highest education level: Not on file  Occupational History  . Not on file  Tobacco Use  . Smoking status: Never Smoker  . Smokeless tobacco: Never Used  Vaping Use  . Vaping Use: Never used  Substance and Sexual Activity  . Alcohol use: No  . Drug use: No  . Sexual activity: Not on file  Other Topics Concern  . Not on file  Social History Narrative  . Not on file   Social Determinants of Health   Financial Resource Strain:   . Difficulty of Paying Living Expenses:   Food Insecurity:   . Worried About Programme researcher, broadcasting/film/video in the Last Year:   . Barista in the Last Year:   Transportation Needs:   . Freight forwarder (Medical):   Marland Kitchen Lack of Transportation (Non-Medical):   Physical Activity:   . Days of Exercise per Week:   . Minutes of Exercise per Session:   Stress:   .  Feeling of Stress :   Social Connections:   . Frequency of Communication with Friends and Family:   . Frequency of Social Gatherings with Friends and Family:   . Attends Religious Services:   . Active Member of Clubs or Organizations:   . Attends Banker Meetings:   Marland Kitchen Marital Status:   Intimate Partner Violence:   . Fear of Current or Ex-Partner:   . Emotionally Abused:   Marland Kitchen Physically Abused:   . Sexually Abused:     Environmental History: The patient lives in a 76 year old house with hardwood floors throughout, gassy, and central air.  There is no known mold/water damage in the home.  There are no pets in the home.  She is a non-smoker.  Current Outpatient Medications  Medication Sig Dispense Refill  . albuterol (VENTOLIN HFA) 108 (90 Base) MCG/ACT inhaler 2 puffs every  4-6 hours as needed for coughing or wheezing spells. Use with spacer. 18 g 1  . alendronate (FOSAMAX) 35 MG tablet Take by mouth.    Marland Kitchen amLODipine (NORVASC) 5 MG tablet Take 1 tablet by mouth daily.    Marland Kitchen apixaban (ELIQUIS) 5 MG TABS tablet 2 tab twice daily x 5 days and then 1 tab twice daily    . JANUVIA 100 MG tablet Take 100 mg by mouth daily.    Marland Kitchen omeprazole (PRILOSEC) 40 MG capsule Take 40 mg by mouth daily.    . Vitamin D, Ergocalciferol, (DRISDOL) 1.25 MG (50000 UT) CAPS capsule Take 50,000 Units by mouth every 7 (seven) days.     . Azelastine & Fluticasone 137 & 50 MCG/ACT THPK Place 1 spray into the nose 2 (two) times daily as needed. 1 each 5  . budesonide-formoterol (SYMBICORT) 160-4.5 MCG/ACT inhaler Inhale 2 puffs into the lungs in the morning and at bedtime. Use with spacer. Rinse, gargle and spit out after use. 1 Inhaler 5  . Olopatadine HCl (PATADAY) 0.2 % SOLN Apply 1 drop to eye daily as needed. 2.5 mL 5   No current facility-administered medications for this visit.    Known medication allergies: Allergies  Allergen Reactions  . Other Other (See Comments)    BRUISES SKIN PER PATIENT REPORT PAPER TAPE OK PER PATIENT  . Aspirin Nausea Only  . Ibuprofen Nausea Only    I appreciate the opportunity to take part in [place] care. Please do not hesitate to contact me with questions.  Sincerely,   R. Jorene Guest, MD

## 2020-05-08 NOTE — Assessment & Plan Note (Signed)
   Aeroallergen avoidance measures have been discussed and provided in written form.  A prescription has been provided for azelastine/fluticasone nasal spray, 1 spray per nostril twice daily as needed. Proper nasal spray technique has been discussed and demonstrated.  Nasal saline spray (i.e. Simply Saline) is recommended prior to medicated nasal sprays and as needed.

## 2020-05-08 NOTE — Assessment & Plan Note (Addendum)
Currently with suboptimal control.  In addition, todays spirometry results, assessed while asymptomatic, suggest under-perception of bronchoconstriction.  A prescription has been provided for Symbicort (budesonide/formoterol) 160/4.5 g, 2 inhalations twice a day. To maximize pulmonary deposition, a spacer has been provided along with instructions for its proper administration with an HFA inhaler.  Continue montelukast 10 mg daily at bedtime.  Continue albuterol HFA, 1 to 2 inhalations every 4-6 hours if needed.  I have also recommended using albuterol 10 to 15 minutes prior to exertion/exercise.  To hasten symptom relief, prednisone has been provided, 20 mg x 4 days, 10 mg x1 day, then stop.  The patient has been asked to contact me if her symptoms persist or progress. Otherwise, she may return for follow up in 6 weeks.

## 2020-05-08 NOTE — Assessment & Plan Note (Signed)
   The patient has been asked to carefully monitor blood glucose levels while on prednisone.  She has verbalized understanding and has agreed to do so. 

## 2020-05-08 NOTE — Assessment & Plan Note (Signed)
   Treatment plan as outlined above for allergic rhinitis.  A prescription has been provided for generic Pataday, one drop per eye daily as needed.  If insurance does not cover this medication, medicated allergy eyedrops may be purchased over-the-counter as Pataday Extra Strength or Zaditor.  I have also recommended eye lubricant drops (i.e., Natural Tears) as needed. 

## 2020-05-08 NOTE — Patient Instructions (Addendum)
Moderate persistent asthma Currently with suboptimal control.  In addition, todays spirometry results, assessed while asymptomatic, suggest under-perception of bronchoconstriction.  A prescription has been provided for Symbicort (budesonide/formoterol) 160/4.5 g, 2 inhalations twice a day. To maximize pulmonary deposition, a spacer has been provided along with instructions for its proper administration with an HFA inhaler.  Continue montelukast 10 mg daily at bedtime.  Continue albuterol HFA, 1 to 2 inhalations every 4-6 hours if needed.  I have also recommended using albuterol 10 to 15 minutes prior to exertion/exercise.  To hasten symptom relief, prednisone has been provided, 20 mg x 4 days, 10 mg x1 day, then stop.  The patient has been asked to contact me if her symptoms persist or progress. Otherwise, she may return for follow up in 6 weeks.  Diabetes mellitus (HCC)  The patient has been asked to carefully monitor blood glucose levels while on prednisone.  She has verbalized understanding and has agreed to do so.  Perennial allergic rhinitis  Aeroallergen avoidance measures have been discussed and provided in written form.  A prescription has been provided for azelastine/fluticasone nasal spray, 1 spray per nostril twice daily as needed. Proper nasal spray technique has been discussed and demonstrated.  Nasal saline spray (i.e. Simply Saline) is recommended prior to medicated nasal sprays and as needed.  Allergic conjunctivitis  Treatment plan as outlined above for allergic rhinitis.  A prescription has been provided for generic Pataday, one drop per eye daily as needed.  If insurance does not cover this medication, medicated allergy eyedrops may be purchased over-the-counter as Art therapist.  I have also recommended eye lubricant drops (i.e., Natural Tears) as needed.   Return in about 6 weeks (around 06/19/2020), or if symptoms worsen or fail to  improve.  Control of Dust Mite Allergen  House dust mites play a major role in allergic asthma and rhinitis.  They occur in environments with high humidity wherever human skin, the food for dust mites is found. High levels have been detected in dust obtained from mattresses, pillows, carpets, upholstered furniture, bed covers, clothes and soft toys.  The principal allergen of the house dust mite is found in its feces.  A gram of dust may contain 1,000 mites and 250,000 fecal particles.  Mite antigen is easily measured in the air during house cleaning activities.    1. Encase mattresses, including the box spring, and pillow, in an air tight cover.  Seal the zipper end of the encased mattresses with wide adhesive tape. 2. Wash the bedding in water of 130 degrees Farenheit weekly.  Avoid cotton comforters/quilts and flannel bedding: the most ideal bed covering is the dacron comforter. 3. Remove all upholstered furniture from the bedroom. 4. Remove carpets, carpet padding, rugs, and non-washable window drapes from the bedroom.  Wash drapes weekly or use plastic window coverings. 5. Remove all non-washable stuffed toys from the bedroom.  Wash stuffed toys weekly. 6. Have the room cleaned frequently with a vacuum cleaner and a damp dust-mop.  The patient should not be in a room which is being cleaned and should wait 1 hour after cleaning before going into the room. 7. Close and seal all heating outlets in the bedroom.  Otherwise, the room will become filled with dust-laden air.  An electric heater can be used to heat the room. Reduce indoor humidity to less than 50%.  Do not use a humidifier.  Control of Dog or Cat Allergen  Avoidance is the best way  to manage a dog or cat allergy. If you have a dog or cat and are allergic to dog or cats, consider removing the dog or cat from the home. If you have a dog or cat but don't want to find it a new home, or if your family wants a pet even though someone in the  household is allergic, here are some strategies that may help keep symptoms at bay:  1. Keep the pet out of your bedroom and restrict it to only a few rooms. Be advised that keeping the dog or cat in only one room will not limit the allergens to that room. 2. Don't pet, hug or kiss the dog or cat; if you do, wash your hands with soap and water. 3. High-efficiency particulate air (HEPA) cleaners run continuously in a bedroom or living room can reduce allergen levels over time. 4. Place electrostatic material sheet in the air inlet vent in the bedroom. 5. Regular use of a high-efficiency vacuum cleaner or a central vacuum can reduce allergen levels. 6. Giving your dog or cat a bath at least once a week can reduce airborne allergen.   Control of Mold Allergen  Mold and fungi can grow on a variety of surfaces provided certain temperature and moisture conditions exist.  Outdoor molds grow on plants, decaying vegetation and soil.  The major outdoor mold, Alternaria and Cladosporium, are found in very high numbers during hot and dry conditions.  Generally, a late Summer - Fall peak is seen for common outdoor fungal spores.  Rain will temporarily lower outdoor mold spore count, but counts rise rapidly when the rainy period ends.  The most important indoor molds are Aspergillus and Penicillium.  Dark, humid and poorly ventilated basements are ideal sites for mold growth.  The next most common sites of mold growth are the bathroom and the kitchen.  Outdoor [employer] 1. Use air conditioning and keep windows closed 2. Avoid exposure to decaying vegetation. 3. Avoid leaf raking. 4. Avoid grain handling. 5. Consider wearing a face mask if working in moldy areas.  Indoor Mold Control 1. Maintain humidity below 50%. 2. Clean washable surfaces with 5% bleach solution. 3. Remove sources e.g. Contaminated carpets.   Control of Cockroach Allergen  Cockroach allergen has been identified as an important  cause of acute attacks of asthma, especially in urban settings.  There are fifty-five species of cockroach that exist in the [place], however only three, the [place], [place] species produce allergen that can affect patients with Asthma.  Allergens can be obtained from fecal particles, egg casings and secretions from cockroaches.    1. Remove food sources. 2. Reduce access to water. 3. Seal access and entry points. 4. Spray runways with 0.5-1% Diazinon or Chlorpyrifos 5. Blow boric acid power under stoves and refrigerator. 6. Place bait stations (hydramethylnon) at feeding sites.

## 2020-06-05 ENCOUNTER — Ambulatory Visit: Payer: Self-pay | Admitting: Allergy and Immunology

## 2020-06-19 ENCOUNTER — Encounter: Payer: Self-pay | Admitting: Allergy and Immunology

## 2020-06-19 ENCOUNTER — Other Ambulatory Visit: Payer: Self-pay

## 2020-06-19 ENCOUNTER — Ambulatory Visit (INDEPENDENT_AMBULATORY_CARE_PROVIDER_SITE_OTHER): Payer: Medicare HMO | Admitting: Allergy and Immunology

## 2020-06-19 VITALS — BP 120/76 | HR 86 | Resp 14

## 2020-06-19 DIAGNOSIS — R0609 Other forms of dyspnea: Secondary | ICD-10-CM

## 2020-06-19 DIAGNOSIS — J4541 Moderate persistent asthma with (acute) exacerbation: Secondary | ICD-10-CM

## 2020-06-19 DIAGNOSIS — E1169 Type 2 diabetes mellitus with other specified complication: Secondary | ICD-10-CM | POA: Diagnosis not present

## 2020-06-19 DIAGNOSIS — J3089 Other allergic rhinitis: Secondary | ICD-10-CM | POA: Diagnosis not present

## 2020-06-19 MED ORDER — SPIRIVA RESPIMAT 1.25 MCG/ACT IN AERS: 2.0000 | INHALATION_SPRAY | Freq: Every day | RESPIRATORY_TRACT | 5 refills | Status: DC

## 2020-06-19 NOTE — Assessment & Plan Note (Signed)
   Continue appropriate allergen avoidance measures.  For now, continue azelastine/fluticasone nasal spray, 1 spray per nostril twice daily as needed.  Nasal saline spray (i.e. Simply Saline) is recommended prior to medicated nasal sprays and as needed.  For thick post nasal drainage, add guaifenesin 600 mg (Mucinex)  twice daily as needed with adequate hydration as discussed.

## 2020-06-19 NOTE — Progress Notes (Signed)
Follow-up Note  RE: Autumn Barton MRN: [id number] DOB: 1946/10/10 Date of Office Visit: 06/19/2020  Primary care provider: Milta Deiters, FNP Referring provider: Milta Deiters, FNP  History of present illness: Autumn Barton is a 74 y.o. female with history of Covid infection, moderate persistent asthma, allergic rhinoconjunctivitis, and diabetes presenting today for sick visit.  She was last seen in this clinic on May 08, 2020.  She had COVID-19 in January 2021.  She reports that her asthma had been well controlled prior to Covid.  However, since that time she has experienced significant dyspnea on exertion as well as fatigue.  During her last visit, she was started on Symbicort 160 and given a prednisone burst/taper.  She reports that the prednisone provided some benefit, however the symptoms promptly returned after the end of the taper.  She received both Pfizer Covid vaccines in May without apparent change in her course.  She has not been experiencing fevers, chills, or discolored mucus production.  She has no nasal or ocular allergy symptom complaints today.  Assessment and plan: Moderate persistent asthma Poorly controlled.  Unclear etiology.  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  An order has been provided for chest x-ray PA and lateral.  A laboratory order form has been provided for total IgE level and CBC with differential to assess the patient's candidacy for biologic agents.  A prescription has been provided for Spiriva Respimat 1.25 g, 2 inhalations daily.  For now, continue Symbicort (budesonide/formoterol) 160/4.5 g, 2 inhalations via spacer device twice a day.  Continue montelukast 10 mg daily at bedtime.  Continue albuterol HFA, 1 to 2 inhalations every 4-6 hours if needed.  I have also recommended using albuterol 10 to 15 minutes prior to exertion/exercise.  Prednisone has been provided, 20 mg x 4 days, 10 mg x1 day, then stop.  The patient is to have labs  drawn prior to starting the prednisone.  A referral will be made for a pulmonology consultation, Dr. Chilton Greathouse, with full pulmonary function tests.  Perennial allergic rhinitis  Continue appropriate allergen avoidance measures.  For now, continue azelastine/fluticasone nasal spray, 1 spray per nostril twice daily as needed.  Nasal saline spray (i.e. Simply Saline) is recommended prior to medicated nasal sprays and as needed.  For thick post nasal drainage, add guaifenesin 600 mg (Mucinex)  twice daily as needed with adequate hydration as discussed.  Diabetes mellitus (HCC)  The patient has been asked to carefully monitor blood glucose levels while on prednisone.  She has verbalized understanding and has agreed to do so.   Meds ordered this encounter  Medications  . Tiotropium Bromide Monohydrate (SPIRIVA RESPIMAT) 1.25 MCG/ACT AERS    Sig: Inhale 2 puffs into the lungs daily.    Dispense:  4 g    Refill:  5    Diagnostics: Spirometry reveals an FVC of 1.61 L and an FEV1 of 1.25 L (84% predicted) with 70 mL (6%) postbronchodilator improvement.  Slight restrictive pattern without significant postbronchodilator reversibility today.  Please see scanned spirometry results for details.    Physical examination: Blood pressure 120/76, pulse 86, resp. rate 14, SpO2 100 %.  General: Alert, interactive, in no acute distress. HEENT: TMs pearly gray, turbinates mildly edematous without discharge, post-pharynx mildly erythematous. Neck: Supple without lymphadenopathy. Lungs: Mildly decreased breath sounds bilaterally without wheezing, rhonchi or rales. CV: Normal S1, S2 without murmurs. Skin: Warm and dry, without lesions or rashes.  The following portions of the patient's history were reviewed and updated  as appropriate: allergies, current medications, past family history, past medical history, past social history, past surgical history and problem list.  Current Outpatient  Medications  Medication Sig Dispense Refill  . albuterol (VENTOLIN HFA) 108 (90 Base) MCG/ACT inhaler 2 puffs every 4-6 hours as needed for coughing or wheezing spells. Use with spacer. 18 g 1  . alendronate (FOSAMAX) 35 MG tablet Take by mouth.    Marland Kitchen amLODipine (NORVASC) 5 MG tablet Take 1 tablet by mouth daily.    Marland Kitchen apixaban (ELIQUIS) 5 MG TABS tablet 2 tab twice daily x 5 days and then 1 tab twice daily    . Azelastine & Fluticasone 137 & 50 MCG/ACT THPK Place 1 spray into the nose 2 (two) times daily as needed. 1 each 5  . budesonide-formoterol (SYMBICORT) 160-4.5 MCG/ACT inhaler Inhale 2 puffs into the lungs in the morning and at bedtime. Use with spacer. Rinse, gargle and spit out after use. 1 Inhaler 5  . celecoxib (CELEBREX) 200 MG capsule Take by mouth.    . diclofenac Sodium (VOLTAREN) 1 % GEL Apply 2 g topically 4 (four) times daily.    . Olopatadine HCl (PATADAY) 0.2 % SOLN Apply 1 drop to eye daily as needed. 2.5 mL 5  . omeprazole (PRILOSEC) 40 MG capsule Take 40 mg by mouth daily.    . Vitamin D, Ergocalciferol, (DRISDOL) 1.25 MG (50000 UT) CAPS capsule Take 50,000 Units by mouth every 7 (seven) days.     . Tiotropium Bromide Monohydrate (SPIRIVA RESPIMAT) 1.25 MCG/ACT AERS Inhale 2 puffs into the lungs daily. 4 g 5   No current facility-administered medications for this visit.    Allergies  Allergen Reactions  . Other Other (See Comments)    BRUISES SKIN PER PATIENT REPORT PAPER TAPE OK PER PATIENT  . Aspirin Nausea Only  . Ibuprofen Nausea Only   Review of systems: Review of systems negative except as noted in HPI / PMHx.  Past Medical History:  Diagnosis Date  . Asthma   . Diabetes mellitus     Family History  Problem Relation Age of Onset  . Eczema Son     Social History   Socioeconomic History  . Marital status: Widowed    Spouse name: Not on file  . Number of children: Not on file  . Years of education: Not on file  . Highest education level: Not on  file  Occupational History  . Not on file  Tobacco Use  . Smoking status: Never Smoker  . Smokeless tobacco: Never Used  Vaping Use  . Vaping Use: Never used  Substance and Sexual Activity  . Alcohol use: No  . Drug use: No  . Sexual activity: Not on file  Other Topics Concern  . Not on file  Social History Narrative  . Not on file   Social Determinants of Health   Financial Resource Strain:   . Difficulty of Paying Living Expenses:   Food Insecurity:   . Worried About Programme researcher, broadcasting/film/video in the Last Year:   . Barista in the Last Year:   Transportation Needs:   . Freight forwarder (Medical):   Marland Kitchen Lack of Transportation (Non-Medical):   Physical Activity:   . Days of Exercise per Week:   . Minutes of Exercise per Session:   Stress:   . Feeling of Stress :   Social Connections:   . Frequency of Communication with Friends and Family:   . Frequency of Social  Gatherings with Friends and Family:   . Attends Religious Services:   . Active Member of Clubs or Organizations:   . Attends Banker Meetings:   Marland Kitchen Marital Status:   Intimate Partner Violence:   . Fear of Current or Ex-Partner:   . Emotionally Abused:   Marland Kitchen Physically Abused:   . Sexually Abused:     I appreciate the opportunity to take part in Kashara's care. Please do not hesitate to contact me with questions.  Sincerely,   R. Jorene Guest, MD

## 2020-06-19 NOTE — Patient Instructions (Addendum)
Moderate persistent asthma Poorly controlled.  Unclear etiology.  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  An order has been provided for chest x-ray PA and lateral.  A laboratory order form has been provided for total IgE level and CBC with differential to assess the patient's candidacy for biologic agents.  A prescription has been provided for Spiriva Respimat 1.25 g, 2 inhalations daily.  For now, continue Symbicort (budesonide/formoterol) 160/4.5 g, 2 inhalations via spacer device twice a day.  Continue montelukast 10 mg daily at bedtime.  Continue albuterol HFA, 1 to 2 inhalations every 4-6 hours if needed.  I have also recommended using albuterol 10 to 15 minutes prior to exertion/exercise.  Prednisone has been provided, 20 mg x 4 days, 10 mg x1 day, then stop.  The patient is to have labs drawn prior to starting the prednisone.  A referral will be made for a pulmonology consultation, Dr. Chilton Greathouse, with full pulmonary function tests.  Perennial allergic rhinitis  Continue appropriate allergen avoidance measures.  For now, continue azelastine/fluticasone nasal spray, 1 spray per nostril twice daily as needed.  Nasal saline spray (i.e. Simply Saline) is recommended prior to medicated nasal sprays and as needed.  For thick post nasal drainage, add guaifenesin 600 mg (Mucinex)  twice daily as needed with adequate hydration as discussed.  Diabetes mellitus (HCC)  The patient has been asked to carefully monitor blood glucose levels while on prednisone.  She has verbalized understanding and has agreed to do so.   Return in about 3 months (around 09/19/2020), or if symptoms worsen or fail to improve.

## 2020-06-19 NOTE — Assessment & Plan Note (Signed)
   The patient has been asked to carefully monitor blood glucose levels while on prednisone.  She has verbalized understanding and has agreed to do so. 

## 2020-06-19 NOTE — Assessment & Plan Note (Addendum)
Poorly controlled.  Unclear etiology.  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  An order has been provided for chest x-ray PA and lateral.  A laboratory order form has been provided for total IgE level and CBC with differential to assess the patient's candidacy for biologic agents.  A prescription has been provided for Spiriva Respimat 1.25 g, 2 inhalations daily.  For now, continue Symbicort (budesonide/formoterol) 160/4.5 g, 2 inhalations via spacer device twice a day.  Continue montelukast 10 mg daily at bedtime.  Continue albuterol HFA, 1 to 2 inhalations every 4-6 hours if needed.  I have also recommended using albuterol 10 to 15 minutes prior to exertion/exercise.  Prednisone has been provided, 20 mg x 4 days, 10 mg x1 day, then stop.  The patient is to have labs drawn prior to starting the prednisone.  A referral will be made for a pulmonology consultation, Dr. Chilton Greathouse, with full pulmonary function tests.

## 2020-06-24 ENCOUNTER — Telehealth: Payer: Self-pay

## 2020-06-24 NOTE — Telephone Encounter (Signed)
-----   Message from [place], [place] sent at 06/19/2020 12:17 PM EDT ----- Regarding: Referral for pulmonology consultaion Patient needs a referral for pulmonology consultation for shortness of breath and coughing per Dr. Nunzio Cobbs.

## 2020-06-24 NOTE — Telephone Encounter (Signed)
I spoke with patient regarding the provider Dr Nunzio Cobbs Recommended in the patients note. Patient only wants to go to a provider in the [facility] area. I am going to place a referral to Dr Su Monks. I will call and get the patient scheduled after lunch as their office has gone to lunch.

## 2020-06-25 ENCOUNTER — Other Ambulatory Visit: Payer: Self-pay

## 2020-06-25 ENCOUNTER — Ambulatory Visit (HOSPITAL_BASED_OUTPATIENT_CLINIC_OR_DEPARTMENT_OTHER)
Admission: RE | Admit: 2020-06-25 | Discharge: 2020-06-25 | Disposition: A | Discharge status: Home / Self Care | Payer: Medicare HMO | Source: Ambulatory Visit | Attending: Allergy and Immunology | Admitting: Allergy and Immunology

## 2020-06-25 DIAGNOSIS — R0609 Other forms of dyspnea: Secondary | ICD-10-CM | POA: Insufficient documentation

## 2020-06-26 NOTE — Telephone Encounter (Signed)
Patient is scheduled to see Dr Su Monks on 09/20 @ 12:40 @ [facility] [address] location. Patient has been informed of this visit.  Thanks

## 2020-06-27 LAB — ALLERGENS W/TOTAL IGE AREA 2
Alternaria Alternata IgE: <0.1 kU/L
Aspergillus Fumigatus IgE: <0.1 kU/L
Bermuda Grass IgE: <0.1 kU/L
Cat Dander IgE: <0.1 kU/L
Cedar, Mountain IgE: <0.1 kU/L
Cladosporium Herbarum IgE: <0.1 kU/L
Cockroach, German IgE: <0.1 kU/L
Common Silver Birch IgE: <0.1 kU/L
Cottonwood IgE: <0.1 kU/L
D Farinae IgE: 0.11 kU/L — AB
D Pteronyssinus IgE: <0.1 kU/L
Dog Dander IgE: <0.1 kU/L
Elm, American IgE: <0.1 kU/L
IgE (Immunoglobulin E), Serum: 212 IU/mL (ref 6–495)
Johnson Grass IgE: <0.1 kU/L
Maple/Box Elder IgE: <0.1 kU/L
Mouse Urine IgE: <0.1 kU/L
Oak, White IgE: <0.1 kU/L
Pecan, Hickory IgE: <0.1 kU/L
Penicillium Chrysogen IgE: <0.1 kU/L
Pigweed, Rough IgE: <0.1 kU/L
Ragweed, Short IgE: <0.1 kU/L
Sheep Sorrel IgE Qn: <0.1 kU/L
Timothy Grass IgE: <0.1 kU/L
White Mulberry IgE: <0.1 kU/L

## 2020-06-27 LAB — CBC WITH DIFFERENTIAL/PLATELET
Basophils Absolute: 0 10*3/uL (ref 0.0–0.2)
Basos: 0 %
EOS (ABSOLUTE): 0.1 10*3/uL (ref 0.0–0.4)
Eos: 2 %
Hematocrit: 30.3 % — ABNORMAL LOW (ref 34.0–46.6)
Hemoglobin: 8.1 g/dL — ABNORMAL LOW (ref 11.1–15.9)
Immature Grans (Abs): 0 10*3/uL (ref 0.0–0.1)
Immature Granulocytes: 1 %
Lymphocytes Absolute: 2.1 10*3/uL (ref 0.7–3.1)
Lymphs: 28 %
MCH: 17.1 pg — ABNORMAL LOW (ref 26.6–33.0)
MCHC: 26.7 g/dL — ABNORMAL LOW (ref 31.5–35.7)
MCV: 64 fL — ABNORMAL LOW (ref 79–97)
Monocytes Absolute: 0.7 10*3/uL (ref 0.1–0.9)
Monocytes: 10 %
NRBC: 1 % — ABNORMAL HIGH (ref 0–0)
Neutrophils Absolute: 4.3 10*3/uL (ref 1.4–7.0)
Neutrophils: 59 %
Platelets: 377 10*3/uL (ref 150–450)
RBC: 4.74 x10E6/uL (ref 3.77–5.28)
RDW: 17.7 % — ABNORMAL HIGH (ref 11.7–15.4)
WBC: 7.3 10*3/uL (ref 3.4–10.8)

## 2020-07-02 ENCOUNTER — Encounter: Payer: Self-pay | Admitting: *Deleted

## 2020-07-02 NOTE — Telephone Encounter (Signed)
Per Dr Nunzio Cobbs, I have moved the patients appointment up sooner to 07/08/20 @ 9 with Dr Su Monks at [address], [place], [place] [address] P: [phone].   I called and left the patient a voicemail to give me a call back.

## 2020-07-02 NOTE — Telephone Encounter (Signed)
Noted. Thanks.

## 2020-07-28 ENCOUNTER — Institutional Professional Consult (permissible substitution): Payer: Medicare HMO | Admitting: Pulmonary Disease

## 2020-09-01 ENCOUNTER — Other Ambulatory Visit: Payer: Self-pay

## 2020-09-01 ENCOUNTER — Ambulatory Visit (INDEPENDENT_AMBULATORY_CARE_PROVIDER_SITE_OTHER): Payer: Medicare HMO | Admitting: Allergy and Immunology

## 2020-09-01 ENCOUNTER — Encounter: Payer: Self-pay | Admitting: Allergy and Immunology

## 2020-09-01 VITALS — BP 126/74 | HR 72 | Temp 98.0°F | Resp 12

## 2020-09-01 DIAGNOSIS — E1169 Type 2 diabetes mellitus with other specified complication: Secondary | ICD-10-CM | POA: Diagnosis not present

## 2020-09-01 DIAGNOSIS — R0609 Other forms of dyspnea: Secondary | ICD-10-CM

## 2020-09-01 DIAGNOSIS — J3089 Other allergic rhinitis: Secondary | ICD-10-CM | POA: Diagnosis not present

## 2020-09-01 DIAGNOSIS — J4541 Moderate persistent asthma with (acute) exacerbation: Secondary | ICD-10-CM

## 2020-09-01 MED ORDER — ALBUTEROL SULFATE HFA 108 (90 BASE) MCG/ACT IN AERS: 2.0000 | INHALATION_SPRAY | RESPIRATORY_TRACT | 1 refills | Status: DC | PRN

## 2020-09-01 NOTE — Assessment & Plan Note (Signed)
   The patient has been asked to carefully monitor blood glucose levels while on prednisone.  She has verbalized understanding and has agreed to do so. 

## 2020-09-01 NOTE — Assessment & Plan Note (Signed)
Poorly controlled.  Unclear etiology.  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  A prescription has been provided for Spiriva Respimat 1.25 g, 2 inhalations daily.  For now, continue Symbicort (budesonide/formoterol) 160/4.5 g, 2 inhalations via spacer device twice a day.  Continue montelukast 10 mg daily at bedtime.  Continue albuterol HFA, 1 to 2 inhalations every 4-6 hours if needed.  I have also recommended using albuterol 10 to 15 minutes prior to exertion/exercise.  Prednisone has been provided, 20 mg x 4 days, 10 mg x1 day, then stop.    The symptoms persist and/or progress despite treatment plan as outlined above, we will initiate a therapeutic trial of Xolair injections.

## 2020-09-01 NOTE — Assessment & Plan Note (Signed)
   Continue appropriate allergen avoidance measures.  For now, continue azelastine/fluticasone nasal spray, 1 spray per nostril twice daily as needed.  Nasal saline spray (i.e. Simply Saline) is recommended prior to medicated nasal sprays and as needed.  For thick post nasal drainage, add guaifenesin 600 mg (Mucinex)  twice daily as needed with adequate hydration as discussed.

## 2020-09-01 NOTE — Assessment & Plan Note (Addendum)
   Given the degree of dyspnea on exertion, it would be prudent for the patient to follow-up with cardiology.

## 2020-09-01 NOTE — Patient Instructions (Addendum)
Moderate persistent asthma Poorly controlled.  Unclear etiology.  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  A prescription has been provided for Spiriva Respimat 1.25 g, 2 inhalations daily.  For now, continue Symbicort (budesonide/formoterol) 160/4.5 g, 2 inhalations via spacer device twice a day.  Continue montelukast 10 mg daily at bedtime.  Continue albuterol HFA, 1 to 2 inhalations every 4-6 hours if needed.  I have also recommended using albuterol 10 to 15 minutes prior to exertion/exercise.  Prednisone has been provided, 20 mg x 4 days, 10 mg x1 day, then stop.    The symptoms persist and/or progress despite treatment plan as outlined above, we will initiate a therapeutic trial of Xolair injections.  Perennial allergic rhinitis  Continue appropriate allergen avoidance measures.  For now, continue azelastine/fluticasone nasal spray, 1 spray per nostril twice daily as needed.  Nasal saline spray (i.e. Simply Saline) is recommended prior to medicated nasal sprays and as needed.  For thick post nasal drainage, add guaifenesin 600 mg (Mucinex)  twice daily as needed with adequate hydration as discussed.  Diabetes mellitus (HCC)  The patient has been asked to carefully monitor blood glucose levels while on prednisone.  She has verbalized understanding and has agreed to do so.  Other forms of dyspnea  Given the degree of dyspnea on exertion, it would be prudent for the patient to follow-up with cardiology.   Return in about 2 months (around 11/01/2020), or if symptoms worsen or fail to improve.

## 2020-09-01 NOTE — Progress Notes (Signed)
Follow-up Note  RE: Tanai Bouler MRN: [id number] DOB: Feb 02, 1946 Date of Office Visit: 09/01/2020  Primary care provider: Milta Deiters, FNP Referring provider: Milta Deiters, FNP  History of present illness: Autumn Barton is a 74 y.o. female with persistent asthma, allergic rhinoconjunctivitis, history of Covid infection, diabetes, and hypertension presenting today for follow-up.  She was last seen in this office on June 19, 2020.  She reports that despite using Symbicort 160-4.5 micro grams, 2 inhalations via spacer device twice daily, and montelukast 10 mg daily, she is still experiencing frequent episodes of dyspnea with exertion.  She experiences limitations in normal daily activities, such as walking to the mailbox, due to associated dyspnea.  She is being followed by her pulmonologist, Dr. Su Monks, for pulmonary nodules and possible pulmonary hypertension.  She reports that her dyspnea on exertion became significant during/after Covid 19 infection.  She had Covid pneumonia and pulmonary embolus.  She is scheduled for sleep study this coming Thursday.  Assessment and plan: Moderate persistent asthma Poorly controlled.  Unclear etiology.  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  A prescription has been provided for Spiriva Respimat 1.25 g, 2 inhalations daily.  For now, continue Symbicort (budesonide/formoterol) 160/4.5 g, 2 inhalations via spacer device twice a day.  Continue montelukast 10 mg daily at bedtime.  Continue albuterol HFA, 1 to 2 inhalations every 4-6 hours if needed.  I have also recommended using albuterol 10 to 15 minutes prior to exertion/exercise.  Prednisone has been provided, 20 mg x 4 days, 10 mg x1 day, then stop.    The symptoms persist and/or progress despite treatment plan as outlined above, we will initiate a therapeutic trial of Xolair injections.  Perennial allergic rhinitis  Continue appropriate allergen avoidance measures.  For now,  continue azelastine/fluticasone nasal spray, 1 spray per nostril twice daily as needed.  Nasal saline spray (i.e. Simply Saline) is recommended prior to medicated nasal sprays and as needed.  For thick post nasal drainage, add guaifenesin 600 mg (Mucinex)  twice daily as needed with adequate hydration as discussed.  Diabetes mellitus (HCC)  The patient has been asked to carefully monitor blood glucose levels while on prednisone.  She has verbalized understanding and has agreed to do so.  Other forms of dyspnea  Given the degree of dyspnea on exertion, it would be prudent for the patient to follow-up with cardiology.   Meds ordered this encounter  Medications  . albuterol (PROAIR HFA) 108 (90 Base) MCG/ACT inhaler    Sig: Inhale 2 puffs into the lungs every 4 (four) hours as needed for wheezing or shortness of breath.    Dispense:  1 each    Refill:  1    Diagnostics: Spirometry reveals an FVC of 1.40 L and an FEV1 of 1.10 L (74% predicted) with 12% postbronchodilator improvement.  Please see scanned spirometry results for details.    Physical examination: Blood pressure 126/74, pulse 72, temperature 98 F (36.7 C), temperature source Tympanic, resp. rate 12, SpO2 99 %.  General: Alert, interactive, in no acute distress. HEENT: TMs pearly gray, turbinates mildly edematous without discharge, post-pharynx mildly erythematous. Neck: Supple without lymphadenopathy. Lungs: Mildly decreased breath sounds bilaterally without wheezing, rhonchi or rales. CV: Normal S1, S2 without murmurs. Skin: Warm and dry, without lesions or rashes.  The following portions of the patient's history were reviewed and updated as appropriate: allergies, current medications, past family history, past medical history, past social history, past surgical history and problem list.  Current Outpatient Medications  Medication Sig Dispense Refill  . alendronate (FOSAMAX) 35 MG tablet Take by mouth.    Marland Kitchen  amLODipine (NORVASC) 5 MG tablet Take 1 tablet by mouth daily.    Marland Kitchen apixaban (ELIQUIS) 5 MG TABS tablet 2 tab twice daily x 5 days and then 1 tab twice daily    . Azelastine & Fluticasone 137 & 50 MCG/ACT THPK Place 1 spray into the nose 2 (two) times daily as needed. 1 each 5  . budesonide-formoterol (SYMBICORT) 160-4.5 MCG/ACT inhaler Inhale 2 puffs into the lungs in the morning and at bedtime. Use with spacer. Rinse, gargle and spit out after use. 1 Inhaler 5  . celecoxib (CELEBREX) 200 MG capsule Take 200 mg by mouth daily.    . diclofenac Sodium (VOLTAREN) 1 % GEL Apply 2 g topically 4 (four) times daily.    Marland Kitchen gabapentin (NEURONTIN) 100 MG capsule Take 100 mg by mouth 2 (two) times daily.    Marland Kitchen HYDROcodone-acetaminophen (NORCO/VICODIN) 5-325 MG tablet Take by mouth as needed.    . hydrOXYzine (ATARAX/VISTARIL) 10 MG tablet Take 10 mg by mouth as needed.    . methocarbamol (ROBAXIN) 500 MG tablet Take 500 mg by mouth as needed.    . montelukast (SINGULAIR) 10 MG tablet Take 10 mg by mouth daily.    . Olopatadine HCl (PATADAY) 0.2 % SOLN Apply 1 drop to eye daily as needed. 2.5 mL 5  . omeprazole (PRILOSEC) 40 MG capsule Take 40 mg by mouth daily.    . sitaGLIPtin (JANUVIA) 100 MG tablet Take 100 mg by mouth daily.    . Vitamin D, Ergocalciferol, (DRISDOL) 1.25 MG (50000 UT) CAPS capsule Take 50,000 Units by mouth every 7 (seven) days.     Marland Kitchen zolpidem (AMBIEN) 5 MG tablet Take 5 mg by mouth at bedtime as needed.    Marland Kitchen albuterol (PROAIR HFA) 108 (90 Base) MCG/ACT inhaler Inhale 2 puffs into the lungs every 4 (four) hours as needed for wheezing or shortness of breath. 1 each 1  . albuterol (VENTOLIN HFA) 108 (90 Base) MCG/ACT inhaler 2 puffs every 4-6 hours as needed for coughing or wheezing spells. Use with spacer. (Patient not taking: Reported on 09/01/2020) 18 g 1  . Tiotropium Bromide Monohydrate (SPIRIVA RESPIMAT) 1.25 MCG/ACT AERS Inhale 2 puffs into the lungs daily. (Patient not taking:  Reported on 09/01/2020) 4 g 5   No current facility-administered medications for this visit.    Allergies  Allergen Reactions  . Adhesive [Tape] Other (See Comments)    BRUISES SKIN PER PATIENT REPORT PAPER TAPE OK PER PATIENT  . Aspirin Nausea Only  . Ibuprofen Nausea Only   Review of systems: Review of systems negative except as noted in HPI / PMHx.  Past Medical History:  Diagnosis Date  . Asthma   . Diabetes mellitus     Family History  Problem Relation Age of Onset  . Eczema Son     Social History   Socioeconomic History  . Marital status: Widowed    Spouse name: Not on file  . Number of children: Not on file  . Years of education: Not on file  . Highest education level: Not on file  Occupational History  . Not on file  Tobacco Use  . Smoking status: Never Smoker  . Smokeless tobacco: Never Used  Vaping Use  . Vaping Use: Never used  Substance and Sexual Activity  . Alcohol use: No  . Drug use: No  . Sexual activity: Not on  file  Other Topics Concern  . Not on file  Social History Narrative  . Not on file   Social Determinants of Health   Financial Resource Strain:   . Difficulty of Paying Living Expenses: Not on file  Food Insecurity:   . Worried About Programme researcher, broadcasting/film/video in the Last Year: Not on file  . Ran Out of Food in the Last Year: Not on file  Transportation Needs:   . Lack of Transportation (Medical): Not on file  . Lack of Transportation (Non-Medical): Not on file  Physical Activity:   . Days of Exercise per Week: Not on file  . Minutes of Exercise per Session: Not on file  Stress:   . Feeling of Stress : Not on file  Social Connections:   . Frequency of Communication with Friends and Family: Not on file  . Frequency of Social Gatherings with Friends and Family: Not on file  . Attends Religious Services: Not on file  . Active Member of Clubs or Organizations: Not on file  . Attends Banker Meetings: Not on file  .  Marital Status: Not on file  Intimate Partner Violence:   . Fear of Current or Ex-Partner: Not on file  . Emotionally Abused: Not on file  . Physically Abused: Not on file  . Sexually Abused: Not on file    I appreciate the opportunity to take part in [place] care. Please do not hesitate to contact me with questions.  Sincerely,   R. Jorene Guest, MD

## 2020-09-23 ENCOUNTER — Ambulatory Visit: Payer: Medicare HMO | Admitting: Allergy and Immunology

## 2020-09-30 ENCOUNTER — Other Ambulatory Visit: Payer: Self-pay | Admitting: Allergy and Immunology

## 2020-11-20 ENCOUNTER — Other Ambulatory Visit: Payer: Self-pay | Admitting: Allergy and Immunology

## 2020-11-26 ENCOUNTER — Ambulatory Visit: Payer: Medicare HMO | Admitting: Allergy and Immunology

## 2020-12-03 ENCOUNTER — Other Ambulatory Visit: Payer: Self-pay | Admitting: Allergy and Immunology

## 2021-02-09 ENCOUNTER — Emergency Department (HOSPITAL_BASED_OUTPATIENT_CLINIC_OR_DEPARTMENT_OTHER): Payer: Medicare HMO

## 2021-02-09 ENCOUNTER — Encounter (HOSPITAL_BASED_OUTPATIENT_CLINIC_OR_DEPARTMENT_OTHER): Payer: Self-pay | Admitting: *Deleted

## 2021-02-09 ENCOUNTER — Other Ambulatory Visit: Payer: Self-pay

## 2021-02-09 ENCOUNTER — Inpatient Hospital Stay (HOSPITAL_BASED_OUTPATIENT_CLINIC_OR_DEPARTMENT_OTHER)
Admission: EM | Admit: 2021-02-09 | Discharge: 2021-02-12 | DRG: 378 | Disposition: A | Discharge status: Home / Self Care | Payer: Medicare HMO | Attending: Family Medicine | Admitting: Family Medicine

## 2021-02-09 DIAGNOSIS — G8929 Other chronic pain: Secondary | ICD-10-CM | POA: Diagnosis present

## 2021-02-09 DIAGNOSIS — Z7901 Long term (current) use of anticoagulants: Secondary | ICD-10-CM

## 2021-02-09 DIAGNOSIS — I10 Essential (primary) hypertension: Secondary | ICD-10-CM | POA: Diagnosis not present

## 2021-02-09 DIAGNOSIS — Z8616 Personal history of COVID-19: Secondary | ICD-10-CM | POA: Diagnosis not present

## 2021-02-09 DIAGNOSIS — K5520 Angiodysplasia of colon without hemorrhage: Secondary | ICD-10-CM

## 2021-02-09 DIAGNOSIS — R1012 Left upper quadrant pain: Secondary | ICD-10-CM

## 2021-02-09 DIAGNOSIS — K558 Other vascular disorders of intestine: Secondary | ICD-10-CM | POA: Diagnosis not present

## 2021-02-09 DIAGNOSIS — Z886 Allergy status to analgesic agent status: Secondary | ICD-10-CM

## 2021-02-09 DIAGNOSIS — Z20822 Contact with and (suspected) exposure to covid-19: Secondary | ICD-10-CM | POA: Diagnosis present

## 2021-02-09 DIAGNOSIS — Z79899 Other long term (current) drug therapy: Secondary | ICD-10-CM

## 2021-02-09 DIAGNOSIS — G4733 Obstructive sleep apnea (adult) (pediatric): Secondary | ICD-10-CM | POA: Diagnosis present

## 2021-02-09 DIAGNOSIS — J3089 Other allergic rhinitis: Secondary | ICD-10-CM | POA: Diagnosis present

## 2021-02-09 DIAGNOSIS — Q2739 Arteriovenous malformation, other site: Secondary | ICD-10-CM

## 2021-02-09 DIAGNOSIS — N1832 Chronic kidney disease, stage 3b: Secondary | ICD-10-CM | POA: Diagnosis present

## 2021-02-09 DIAGNOSIS — Z86711 Personal history of pulmonary embolism: Secondary | ICD-10-CM

## 2021-02-09 DIAGNOSIS — Z7951 Long term (current) use of inhaled steroids: Secondary | ICD-10-CM

## 2021-02-09 DIAGNOSIS — Z791 Long term (current) use of non-steroidal anti-inflammatories (NSAID): Secondary | ICD-10-CM

## 2021-02-09 DIAGNOSIS — D509 Iron deficiency anemia, unspecified: Secondary | ICD-10-CM

## 2021-02-09 DIAGNOSIS — D649 Anemia, unspecified: Secondary | ICD-10-CM | POA: Diagnosis not present

## 2021-02-09 DIAGNOSIS — R0600 Dyspnea, unspecified: Secondary | ICD-10-CM | POA: Diagnosis not present

## 2021-02-09 DIAGNOSIS — Z7983 Long term (current) use of bisphosphonates: Secondary | ICD-10-CM | POA: Diagnosis not present

## 2021-02-09 DIAGNOSIS — D62 Acute posthemorrhagic anemia: Secondary | ICD-10-CM | POA: Diagnosis present

## 2021-02-09 DIAGNOSIS — Z86718 Personal history of other venous thrombosis and embolism: Secondary | ICD-10-CM | POA: Diagnosis not present

## 2021-02-09 DIAGNOSIS — K5731 Diverticulosis of large intestine without perforation or abscess with bleeding: Secondary | ICD-10-CM | POA: Diagnosis not present

## 2021-02-09 DIAGNOSIS — I129 Hypertensive chronic kidney disease with stage 1 through stage 4 chronic kidney disease, or unspecified chronic kidney disease: Secondary | ICD-10-CM | POA: Diagnosis present

## 2021-02-09 DIAGNOSIS — E1122 Type 2 diabetes mellitus with diabetic chronic kidney disease: Secondary | ICD-10-CM | POA: Diagnosis present

## 2021-02-09 DIAGNOSIS — K921 Melena: Secondary | ICD-10-CM | POA: Diagnosis present

## 2021-02-09 DIAGNOSIS — R195 Other fecal abnormalities: Secondary | ICD-10-CM | POA: Diagnosis not present

## 2021-02-09 DIAGNOSIS — E1169 Type 2 diabetes mellitus with other specified complication: Secondary | ICD-10-CM | POA: Diagnosis not present

## 2021-02-09 DIAGNOSIS — I340 Nonrheumatic mitral (valve) insufficiency: Secondary | ICD-10-CM | POA: Diagnosis not present

## 2021-02-09 DIAGNOSIS — Z6841 Body Mass Index (BMI) 40.0 and over, adult: Secondary | ICD-10-CM | POA: Diagnosis not present

## 2021-02-09 DIAGNOSIS — K449 Diaphragmatic hernia without obstruction or gangrene: Secondary | ICD-10-CM | POA: Diagnosis present

## 2021-02-09 DIAGNOSIS — Z7984 Long term (current) use of oral hypoglycemic drugs: Secondary | ICD-10-CM

## 2021-02-09 DIAGNOSIS — J4540 Moderate persistent asthma, uncomplicated: Secondary | ICD-10-CM | POA: Diagnosis present

## 2021-02-09 DIAGNOSIS — Z91048 Other nonmedicinal substance allergy status: Secondary | ICD-10-CM

## 2021-02-09 DIAGNOSIS — E119 Type 2 diabetes mellitus without complications: Secondary | ICD-10-CM

## 2021-02-09 DIAGNOSIS — K922 Gastrointestinal hemorrhage, unspecified: Secondary | ICD-10-CM | POA: Diagnosis not present

## 2021-02-09 LAB — HEMOGLOBIN A1C
Hgb A1c MFr Bld: 6.4 % — ABNORMAL HIGH (ref 4.8–5.6)
Mean Plasma Glucose: 136.98 mg/dL

## 2021-02-09 LAB — COMPREHENSIVE METABOLIC PANEL
ALT: 10 U/L (ref 0–44)
AST: 21 U/L (ref 15–41)
Albumin: 3.8 g/dL (ref 3.5–5.0)
Alkaline Phosphatase: 51 U/L (ref 38–126)
Anion gap: 8 (ref 5–15)
BUN: 14 mg/dL (ref 8–23)
CO2: 22 mmol/L (ref 22–32)
Calcium: 8.9 mg/dL (ref 8.9–10.3)
Chloride: 103 mmol/L (ref 98–111)
Creatinine, Ser: 1.31 mg/dL — ABNORMAL HIGH (ref 0.44–1.00)
GFR, Estimated: 42 mL/min — ABNORMAL LOW (ref >60)
Glucose, Bld: 123 mg/dL — ABNORMAL HIGH (ref 70–99)
Potassium: 4 mmol/L (ref 3.5–5.1)
Sodium: 133 mmol/L — ABNORMAL LOW (ref 135–145)
Total Bilirubin: 0.2 mg/dL — ABNORMAL LOW (ref 0.3–1.2)
Total Protein: 7.8 g/dL (ref 6.5–8.1)

## 2021-02-09 LAB — URINALYSIS, ROUTINE W REFLEX MICROSCOPIC
Bilirubin Urine: NEGATIVE
Glucose, UA: NEGATIVE mg/dL
Hgb urine dipstick: NEGATIVE
Ketones, ur: NEGATIVE mg/dL
Leukocytes,Ua: NEGATIVE
Nitrite: NEGATIVE
Protein, ur: NEGATIVE mg/dL
Specific Gravity, Urine: <1.005 — ABNORMAL LOW (ref 1.005–1.030)
pH: 6 (ref 5.0–8.0)

## 2021-02-09 LAB — CBC WITH DIFFERENTIAL/PLATELET
Abs Immature Granulocytes: 0.02 10*3/uL (ref 0.00–0.07)
Basophils Absolute: 0 10*3/uL (ref 0.0–0.1)
Basophils Relative: 0 %
Eosinophils Absolute: 0.1 10*3/uL (ref 0.0–0.5)
Eosinophils Relative: 1 %
HCT: 27.3 % — ABNORMAL LOW (ref 36.0–46.0)
Hemoglobin: 7.6 g/dL — ABNORMAL LOW (ref 12.0–15.0)
Immature Granulocytes: 0 %
Lymphocytes Relative: 22 %
Lymphs Abs: 1.6 10*3/uL (ref 0.7–4.0)
MCH: 19.4 pg — ABNORMAL LOW (ref 26.0–34.0)
MCHC: 27.8 g/dL — ABNORMAL LOW (ref 30.0–36.0)
MCV: 69.6 fL — ABNORMAL LOW (ref 80.0–100.0)
Monocytes Absolute: 0.7 10*3/uL (ref 0.1–1.0)
Monocytes Relative: 9 %
Neutro Abs: 4.7 10*3/uL (ref 1.7–7.7)
Neutrophils Relative %: 68 %
Platelets: 339 10*3/uL (ref 150–400)
RBC: 3.92 MIL/uL (ref 3.87–5.11)
RDW: 20.8 % — ABNORMAL HIGH (ref 11.5–15.5)
WBC: 7 10*3/uL (ref 4.0–10.5)
nRBC: 0.9 % — ABNORMAL HIGH (ref 0.0–0.2)

## 2021-02-09 LAB — PREPARE RBC (CROSSMATCH)

## 2021-02-09 LAB — TROPONIN I (HIGH SENSITIVITY)
Troponin I (High Sensitivity): 6 ng/L (ref <18)
Troponin I (High Sensitivity): 7 ng/L (ref <18)

## 2021-02-09 LAB — ABO/RH: ABO/RH(D): B POS

## 2021-02-09 LAB — CBG MONITORING, ED: Glucose-Capillary: 92 mg/dL (ref 70–99)

## 2021-02-09 LAB — LIPASE, BLOOD: Lipase: 41 U/L (ref 11–51)

## 2021-02-09 LAB — OCCULT BLOOD X 1 CARD TO LAB, STOOL: Fecal Occult Bld: POSITIVE — AB

## 2021-02-09 LAB — RESP PANEL BY RT-PCR (FLU A&B, COVID) ARPGX2
Influenza A by PCR: NEGATIVE
Influenza B by PCR: NEGATIVE
SARS Coronavirus 2 by RT PCR: NEGATIVE

## 2021-02-09 LAB — BRAIN NATRIURETIC PEPTIDE: B Natriuretic Peptide: 70.6 pg/mL (ref 0.0–100.0)

## 2021-02-09 MED ORDER — HYDROXYZINE HCL 10 MG PO TABS
10.0000 mg | ORAL_TABLET | Freq: Three times a day (TID) | ORAL | Status: DC | PRN
  Administered 2021-02-09 – 2021-02-10 (×2): 10 mg via ORAL
  Filled 2021-02-09 (×3): qty 1

## 2021-02-09 MED ORDER — SODIUM CHLORIDE 0.9 % IV SOLN
80.0000 mg | Freq: Once | INTRAVENOUS | Status: AC
  Administered 2021-02-09: 80 mg via INTRAVENOUS
  Filled 2021-02-09: qty 80

## 2021-02-09 MED ORDER — MOMETASONE FURO-FORMOTEROL FUM 200-5 MCG/ACT IN AERO
2.0000 | INHALATION_SPRAY | Freq: Two times a day (BID) | RESPIRATORY_TRACT | Status: DC
  Administered 2021-02-10 – 2021-02-12 (×5): 2 via RESPIRATORY_TRACT
  Filled 2021-02-09: qty 8.8

## 2021-02-09 MED ORDER — IOHEXOL 350 MG/ML SOLN
100.0000 mL | Freq: Once | INTRAVENOUS | Status: AC | PRN
  Administered 2021-02-09: 100 mL via INTRAVENOUS

## 2021-02-09 MED ORDER — CYANOCOBALAMIN 1000 MCG/ML IJ SOLN
1000.0000 ug | INTRAMUSCULAR | Status: DC
  Administered 2021-02-09: 1000 ug via SUBCUTANEOUS
  Filled 2021-02-09: qty 1

## 2021-02-09 MED ORDER — ZOLPIDEM TARTRATE 5 MG PO TABS
5.0000 mg | ORAL_TABLET | Freq: Every evening | ORAL | Status: DC | PRN
  Administered 2021-02-09: 5 mg via ORAL
  Filled 2021-02-09: qty 1

## 2021-02-09 MED ORDER — MORPHINE SULFATE (PF) 4 MG/ML IV SOLN
3.0000 mg | Freq: Once | INTRAVENOUS | Status: AC
  Administered 2021-02-09: 3 mg via INTRAVENOUS
  Filled 2021-02-09: qty 1

## 2021-02-09 MED ORDER — VITAMIN D (ERGOCALCIFEROL) 1.25 MG (50000 UNIT) PO CAPS: 50000.0000 [IU] | ORAL_CAPSULE | ORAL | Status: DC

## 2021-02-09 MED ORDER — SODIUM CHLORIDE 0.9 % IV SOLN: INTRAVENOUS | Status: DC | PRN

## 2021-02-09 MED ORDER — LIDOCAINE 5 % EX PTCH: 1.0000 | MEDICATED_PATCH | Freq: Every day | CUTANEOUS | Status: DC | PRN

## 2021-02-09 MED ORDER — ACETAMINOPHEN 650 MG RE SUPP: 650.0000 mg | Freq: Four times a day (QID) | RECTAL | Status: DC | PRN

## 2021-02-09 MED ORDER — ONDANSETRON HCL 4 MG PO TABS: 4.0000 mg | ORAL_TABLET | Freq: Four times a day (QID) | ORAL | Status: DC | PRN

## 2021-02-09 MED ORDER — PANTOPRAZOLE SODIUM 40 MG IV SOLR
INTRAVENOUS | Status: AC
  Filled 2021-02-09: qty 160

## 2021-02-09 MED ORDER — MORPHINE SULFATE (PF) 2 MG/ML IV SOLN
2.0000 mg | INTRAVENOUS | Status: DC | PRN
  Administered 2021-02-09: 4 mg via INTRAVENOUS
  Administered 2021-02-11: 2 mg via INTRAVENOUS
  Filled 2021-02-09: qty 2
  Filled 2021-02-09: qty 1

## 2021-02-09 MED ORDER — SODIUM CHLORIDE 0.9 % IV SOLN
8.0000 mg/h | INTRAVENOUS | Status: DC
  Administered 2021-02-09: 8 mg/h via INTRAVENOUS
  Filled 2021-02-09 (×2): qty 80

## 2021-02-09 MED ORDER — SODIUM CHLORIDE 0.9% IV SOLUTION: Freq: Once | INTRAVENOUS | Status: AC

## 2021-02-09 MED ORDER — ALBUTEROL SULFATE HFA 108 (90 BASE) MCG/ACT IN AERS
2.0000 | INHALATION_SPRAY | RESPIRATORY_TRACT | Status: DC | PRN
  Filled 2021-02-09: qty 6.7

## 2021-02-09 MED ORDER — ACETAMINOPHEN 325 MG PO TABS
650.0000 mg | ORAL_TABLET | Freq: Four times a day (QID) | ORAL | Status: DC | PRN
  Administered 2021-02-12: 650 mg via ORAL
  Filled 2021-02-09: qty 2

## 2021-02-09 MED ORDER — FLUTICASONE PROPIONATE 50 MCG/ACT NA SUSP
2.0000 | Freq: Every day | NASAL | Status: DC | PRN
  Filled 2021-02-09: qty 16

## 2021-02-09 MED ORDER — MONTELUKAST SODIUM 10 MG PO TABS
10.0000 mg | ORAL_TABLET | Freq: Every morning | ORAL | Status: DC
  Administered 2021-02-10 – 2021-02-12 (×3): 10 mg via ORAL
  Filled 2021-02-09 (×3): qty 1

## 2021-02-09 MED ORDER — PANTOPRAZOLE SODIUM 40 MG IV SOLR: 40.0000 mg | Freq: Two times a day (BID) | INTRAVENOUS | Status: DC

## 2021-02-09 MED ORDER — ONDANSETRON HCL 4 MG/2ML IJ SOLN: 4.0000 mg | Freq: Four times a day (QID) | INTRAMUSCULAR | Status: DC | PRN

## 2021-02-09 MED ORDER — AMLODIPINE BESYLATE 5 MG PO TABS
5.0000 mg | ORAL_TABLET | Freq: Every morning | ORAL | Status: DC
  Administered 2021-02-10 – 2021-02-12 (×3): 5 mg via ORAL
  Filled 2021-02-09 (×3): qty 1

## 2021-02-09 MED ORDER — INSULIN ASPART 100 UNIT/ML ~~LOC~~ SOLN
0.0000 [IU] | SUBCUTANEOUS | Status: DC
  Administered 2021-02-10 – 2021-02-11 (×6): 1 [IU] via SUBCUTANEOUS
  Administered 2021-02-11: 2 [IU] via SUBCUTANEOUS

## 2021-02-09 NOTE — ED Notes (Signed)
Daughter made aware of bed that patient has been assigned per patient request.

## 2021-02-09 NOTE — ED Triage Notes (Signed)
Sob and sharp pain in her LUQ for a week. Her MD has seen her and scheduled her for further testing next week. EKG at triage.

## 2021-02-09 NOTE — Discharge Instructions (Addendum)
You were found to be anemic today.  Your hemoglobin was low.  Please call your hematologist at [facility] first thing tomorrow to ask for a follow up visit.  You may need another transfusion or treatment in the office.  Hematology and Oncology - [facility], [facility]   [address] [place], [place] [address]   [phone]   Hyacinth Meeker, MD   [address]   [place], [place] [address]   [phone] (Work)   [phone] (Fax)

## 2021-02-09 NOTE — ED Notes (Signed)
Patient transported to CT 

## 2021-02-09 NOTE — ED Provider Notes (Signed)
Patient care assumed at 1500.  Patient with history of iron deficiency anemia, PE on Xarelto here for evaluation of progressive shortness of breath, left upper quadrant pain. She reports of black stools for the last three weeks. Labs significant for anemia of 7.6. On review and care everywhere she had a hemoglobin of 10 in February. She does have him positive stool in the emergency department. She states that she has seen a gastroenterologist in the past but is unsure who it is. She is unsure of her prior G.I. diagnoses. She states that she has not received a blood transfusion in the past but is agreeable to a transfusion if it is needed. Concern for symptomatic anemia in the setting of active G.I. bleed. Recommend admission for further workup and treatment. Hospitalist consulted for admission.   Tilden Fossa, MD 02/10/21 [phone]

## 2021-02-09 NOTE — H&P (Addendum)
History and Physical    Autumn Barton ZOX:[id number] DOB: 15-Jul-1946 DOA: 02/09/2021  PCP: Milta Deiters, FNP  Patient coming from: Home  I have personally briefly reviewed patient's old medical records in [facility]  Chief Complaint: SOB  HPI: Autumn Barton is a 75 y.o. female with medical history significant of DM2, asthma.  Pt on Xarelto for DVT/PE back in Jan 2021 in setting of COVID-19 illness.  Pt presents to ED with c/o SOB, Abd pain / CP.  LUQ  And LL chest pain.  Symptoms onset some months ago but worsening for the past 3 days.  Worked up as outpt, noted to have iron def anemia, supposed to see GI doc at [facility] this week but came in to ED due to worsening symptoms.  SOB worse with exertion.  Symptoms are severe.  She has had melena for the past 3 weeks she relates.  No fevers, chills, cough, emesis.  Does have somewhat chronic use of NSAIDS   ED Course: HGB 7.6 today, hemoccult positive.  Anemia looks microcytic and hypochromic.  Pt transferred from [facility] to [facility] for admission.  CTA chest/abd/pelvis: 1) no acute findings 2) specifically no PE.  Trop neg.  Hemoccult positive.   Review of Systems: As per HPI, otherwise all review of systems negative.  Past Medical History:  Diagnosis Date  . Asthma   . Diabetes mellitus     Past Surgical History:  Procedure Laterality Date  . BACK SURGERY  2020  . COSMETIC SURGERY    . TOTAL KNEE ARTHROPLASTY       reports that she has never smoked. She has never used smokeless tobacco. She reports that she does not drink alcohol and does not use drugs.  Allergies  Allergen Reactions  . Adhesive [Tape] Other (See Comments)    BRUISES SKIN PER PATIENT REPORT PAPER TAPE OK PER PATIENT  . Aspirin Nausea Only  . Ibuprofen Nausea Only    Family History  Problem Relation Age of Onset  . Eczema Son      Prior to Admission medications   Medication Sig Start Date End Date Taking? Authorizing Provider  albuterol  (PROAIR HFA) 108 (90 Base) MCG/ACT inhaler Inhale 2 puffs into the lungs every 4 (four) hours as needed for wheezing or shortness of breath. 09/01/20   Bobbitt, Heywood Iles, MD  albuterol (VENTOLIN HFA) 108 (90 Base) MCG/ACT inhaler 2 PUFFS EVERY 4-6 HOURS AS NEEDED FOR COUGHING OR WHEEZING SPELLS. USE WITH SPACER. 12/03/20   Bobbitt, Heywood Iles, MD  alendronate (FOSAMAX) 35 MG tablet Take by mouth. 11/03/18   [provider]  amLODipine (NORVASC) 5 MG tablet Take 1 tablet by mouth daily. 07/06/19   [provider]  apixaban (ELIQUIS) 5 MG TABS tablet 2 tab twice daily x 5 days and then 1 tab twice daily 11/19/19   [provider]  Azelastine & Fluticasone 137 & 50 MCG/ACT THPK Place 1 spray into the nose 2 (two) times daily as needed. 05/08/20   Bobbitt, Heywood Iles, MD  celecoxib (CELEBREX) 200 MG capsule Take 200 mg by mouth daily. 08/06/20   [provider]  diclofenac Sodium (VOLTAREN) 1 % GEL Apply 2 g topically 4 (four) times daily. 05/23/20   [provider]  gabapentin (NEURONTIN) 100 MG capsule Take 100 mg by mouth 2 (two) times daily. 08/07/20   [provider]  HYDROcodone-acetaminophen (NORCO/VICODIN) 5-325 MG tablet Take by mouth as needed. 08/27/20   [provider]  hydrOXYzine (ATARAX/VISTARIL)  10 MG tablet Take 10 mg by mouth as needed. 08/23/20   [provider]  methocarbamol (ROBAXIN) 500 MG tablet Take 500 mg by mouth as needed. 08/22/20   [provider]  montelukast (SINGULAIR) 10 MG tablet Take 10 mg by mouth daily. 08/04/20   [provider]  Olopatadine HCl (PATADAY) 0.2 % SOLN Apply 1 drop to eye daily as needed. 05/08/20   Bobbitt, Heywood Iles, MD  omeprazole (PRILOSEC) 40 MG capsule Take 40 mg by mouth daily.    [provider]  sitaGLIPtin (JANUVIA) 100 MG tablet Take 100 mg by mouth daily. 07/21/20   [provider]  SYMBICORT 160-4.5 MCG/ACT inhaler INHALE 2 PUFFS INTO  THE LUNGS IN THE MORNING AND AT BEDTIME. USE WITH SPACER. RINSE, GARGLE AND SPIT OUT AFTER USE. 11/20/20   Bobbitt, Heywood Iles, MD  Tiotropium Bromide Monohydrate (SPIRIVA RESPIMAT) 1.25 MCG/ACT AERS Inhale 2 puffs into the lungs daily. Patient not taking: Reported on 09/01/2020 06/19/20   Bobbitt, Heywood Iles, MD  Vitamin D, Ergocalciferol, (DRISDOL) 1.25 MG (50000 UT) CAPS capsule Take 50,000 Units by mouth every 7 (seven) days.  09/23/16   [provider]  zolpidem (AMBIEN) 5 MG tablet Take 5 mg by mouth at bedtime as needed. 08/23/20   [provider]    Physical Exam: Vitals:   02/09/21 1709 02/09/21 1926 02/09/21 2120 02/09/21 2122  BP: 128/75 105/73  139/75  Pulse: 83 90  78  Resp: 18 20  15   Temp:  98.2 F (36.8 C)  98.2 F (36.8 C)  TempSrc:    Oral  SpO2: 100% 100%  100%  Weight:   100.1 kg   Height:        Constitutional: NAD, calm, comfortable Eyes: PERRL, lids and conjunctivae normal ENMT: Mucous membranes are moist. Posterior pharynx clear of any exudate or lesions.Normal dentition.  Neck: normal, supple, no masses, no thyromegaly Respiratory: clear to auscultation bilaterally, no wheezing, no crackles. Normal respiratory effort. No accessory muscle use.  Cardiovascular: Regular rate and rhythm, no murmurs / rubs / gallops. No extremity edema. 2+ pedal pulses. No carotid bruits.  Abdomen: LUQ TTP Musculoskeletal: no clubbing / cyanosis. No joint deformity upper and lower extremities. Good ROM, no contractures. Normal muscle tone.  Skin: no rashes, lesions, ulcers. No induration Neurologic: CN 2-12 grossly intact. Sensation intact, DTR normal. Strength 5/5 in all 4.  Psychiatric: Normal judgment and insight. Alert and oriented x 3. Normal mood.    Labs on Admission: I have personally reviewed following labs and imaging studies  CBC: Recent Labs  Lab 02/09/21 1315  WBC 7.0  NEUTROABS 4.7  HGB 7.6*  HCT 27.3*  MCV 69.6*  PLT 339   Basic  Metabolic Panel: Recent Labs  Lab 02/09/21 1315  NA 133*  K 4.0  CL 103  CO2 22  GLUCOSE 123*  BUN 14  CREATININE 1.31*  CALCIUM 8.9   GFR: Estimated Creatinine Clearance: 40.4 mL/min (A) (by C-G formula based on SCr of 1.31 mg/dL (H)). Liver Function Tests: Recent Labs  Lab 02/09/21 1315  AST 21  ALT 10  ALKPHOS 51  BILITOT 0.2*  PROT 7.8  ALBUMIN 3.8   Recent Labs  Lab 02/09/21 1315  LIPASE 41   No results for input(s): AMMONIA in the last 168 hours. Coagulation Profile: No results for input(s): INR, PROTIME in the last 168 hours. Cardiac Enzymes: No results for input(s): CKTOTAL, CKMB, CKMBINDEX, TROPONINI in the last 168 hours. BNP (last  3 results) No results for input(s): PROBNP in the last 8760 hours. HbA1C: No results for input(s): HGBA1C in the last 72 hours. CBG: Recent Labs  Lab 02/09/21 1821  GLUCAP 92   Lipid Profile: No results for input(s): CHOL, HDL, LDLCALC, TRIG, CHOLHDL, LDLDIRECT in the last 72 hours. Thyroid Function Tests: No results for input(s): TSH, T4TOTAL, FREET4, T3FREE, THYROIDAB in the last 72 hours. Anemia Panel: No results for input(s): VITAMINB12, FOLATE, FERRITIN, TIBC, IRON, RETICCTPCT in the last 72 hours. Urine analysis:    Component Value Date/Time   COLORURINE YELLOW 02/09/2021 1720   APPEARANCEUR CLEAR 02/09/2021 1720   LABSPEC <1.005 (L) 02/09/2021 1720   PHURINE 6.0 02/09/2021 1720   GLUCOSEU NEGATIVE 02/09/2021 1720   HGBUR NEGATIVE 02/09/2021 1720   BILIRUBINUR NEGATIVE 02/09/2021 1720   KETONESUR NEGATIVE 02/09/2021 1720   PROTEINUR NEGATIVE 02/09/2021 1720   NITRITE NEGATIVE 02/09/2021 1720   LEUKOCYTESUR NEGATIVE 02/09/2021 1720    Radiological Exams on Admission: CT Angio Chest PE W and/or Wo Contrast  Result Date: 02/09/2021 CLINICAL DATA:  Pulmonary embolus suspected. Soreness of breath and sharp pain in left upper quadrant for a week. EXAM: CT ANGIOGRAPHY CHEST CT ABDOMEN AND PELVIS WITH CONTRAST  TECHNIQUE: Multidetector CT imaging of the chest was performed using the standard protocol during bolus administration of intravenous contrast. Multiplanar CT image reconstructions and MIPs were obtained to evaluate the vascular anatomy. Multidetector CT imaging of the abdomen and pelvis was performed using the standard protocol during bolus administration of intravenous contrast. CONTRAST:  100mL OMNIPAQUE IOHEXOL 350 MG/ML SOLN COMPARISON:  CT chest 11/12/2020 FINDINGS: CTA CHEST FINDINGS Cardiovascular: Satisfactory opacification of the pulmonary arteries to the segmental level. No evidence of pulmonary embolism. The main pulmonary artery measures at the upper limits of normal. Normal heart size. No significant pericardial effusion. The thoracic aorta is normal in caliber. Mild atherosclerotic plaque of the thoracic aorta. Mediastinum/Nodes: Redemonstration of a precardiac/upper abdominal fluid density lesion measuring approximately 5 x 3.5 x 5.5 cm (11:11, 15:69) likely representing a pericardial cyst. No enlarged mediastinal, hilar, or axillary lymph nodes. Thyroid gland, trachea, and esophagus demonstrate no significant findings. Lungs/Pleura: Expiratory phase of respiration. Subsegmental atelectasis. Mosaic attenuation due to low lung volumes. Nonspecific thin walled cystic lesion within the right lower lobe. No definite pulmonary mass. No pulmonary nodule. No pleural effusion. No pneumothorax. Pleural effusion or pneumothorax. Musculoskeletal: No chest wall abnormality. Lower thoracic vertebral body hemangioma noted. No acute or significant osseous findings. Review of the MIP images confirms the above findings. CT ABDOMEN and PELVIS FINDINGS Hepatobiliary: No focal liver abnormality. No gallstones, gallbladder wall thickening, or pericholecystic fluid. No biliary dilatation. Pancreas: No focal lesion. Normal pancreatic contour. No surrounding inflammatory changes. No main pancreatic ductal dilatation. Spleen:  Normal in size without focal abnormality. Adrenals/Urinary Tract: Adrenal glands are unremarkable. Pericentimeter fluid density lesions likely represent simple renal cysts. Otherwise the kidneys are normal, without renal calculi, focal lesion, or hydronephrosis. Bladder is unremarkable. Stomach/Bowel: Stomach is within normal limits. No evidence of bowel wall thickening or dilatation. The appendix not definitely identified. Scattered colonic diverticulosis. Vascular/Lymphatic: No abdominal aorta or iliac aneurysm. Mild atherosclerotic plaque of the aorta and its branches. No abdominal, pelvic, or inguinal lymphadenopathy. Reproductive: Coarsely calcified lesion within the uterus likely represents a uterine fibroid. Uterus and bilateral adnexa are unremarkable. Other: No intraperitoneal free fluid. No intraperitoneal free gas. No organized fluid collection. Musculoskeletal: Tiny fat containing umbilical hernia. No suspicious lytic or blastic osseous lesions. No acute displaced  fracture. Multilevel degenerative changes of the spine. L5-S1 posterolateral fusion. Review of the MIP images confirms the above findings. IMPRESSION: 1. No pulmonary embolus. 2. 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 3. Scattered colonic diverticulosis with no acute diverticulitis. 4. Degenerative uterine fibroid. 5. Otherwise no acute intra-abdominal or intrapelvic abnormality. Electronically Signed   By: Tish Frederickson M.D.   On: 02/09/2021 16:21   CT ABDOMEN PELVIS W CONTRAST  Result Date: 02/09/2021 CLINICAL DATA:  Pulmonary embolus suspected. Soreness of breath and sharp pain in left upper quadrant for a week. EXAM: CT ANGIOGRAPHY CHEST CT ABDOMEN AND PELVIS WITH CONTRAST TECHNIQUE: Multidetector CT imaging of the chest was performed using the standard protocol during bolus administration of intravenous contrast. Multiplanar CT image reconstructions and MIPs were obtained to evaluate the  vascular anatomy. Multidetector CT imaging of the abdomen and pelvis was performed using the standard protocol during bolus administration of intravenous contrast. CONTRAST:  OMNIPAQUE IOHEXOL 350 MG/ML SOLN COMPARISON:  CT chest 11/12/2020 FINDINGS: CTA CHEST FINDINGS Cardiovascular: Satisfactory opacification of the pulmonary arteries to the segmental level. No evidence of pulmonary embolism. The main pulmonary artery measures at the upper limits of normal. Normal heart size. No significant pericardial effusion. The thoracic aorta is normal in caliber. Mild atherosclerotic plaque of the thoracic aorta. Mediastinum/Nodes: Redemonstration of a precardiac/upper abdominal fluid density lesion measuring approximately 5 x 3.5 x 5.5 cm (11:11, 15:69) likely representing a pericardial cyst. No enlarged mediastinal, hilar, or axillary lymph nodes. Thyroid gland, trachea, and esophagus demonstrate no significant findings. Lungs/Pleura: Expiratory phase of respiration. Subsegmental atelectasis. Mosaic attenuation due to low lung volumes. Nonspecific thin walled cystic lesion within the right lower lobe. No definite pulmonary mass. No pulmonary nodule. No pleural effusion. No pneumothorax. Pleural effusion or pneumothorax. Musculoskeletal: No chest wall abnormality. Lower thoracic vertebral body hemangioma noted. No acute or significant osseous findings. Review of the MIP images confirms the above findings. CT ABDOMEN and PELVIS FINDINGS Hepatobiliary: No focal liver abnormality. No gallstones, gallbladder wall thickening, or pericholecystic fluid. No biliary dilatation. Pancreas: No focal lesion. Normal pancreatic contour. No surrounding inflammatory changes. No main pancreatic ductal dilatation. Spleen: Normal in size without focal abnormality. Adrenals/Urinary Tract: Adrenal glands are unremarkable. Pericentimeter fluid density lesions likely represent simple renal cysts. Otherwise the kidneys are normal, without renal  calculi, focal lesion, or hydronephrosis. Bladder is unremarkable. Stomach/Bowel: Stomach is within normal limits. No evidence of bowel wall thickening or dilatation. The appendix not definitely identified. Scattered colonic diverticulosis. Vascular/Lymphatic: No abdominal aorta or iliac aneurysm. Mild atherosclerotic plaque of the aorta and its branches. No abdominal, pelvic, or inguinal lymphadenopathy. Reproductive: Coarsely calcified lesion within the uterus likely represents a uterine fibroid. Uterus and bilateral adnexa are unremarkable. Other: No intraperitoneal free fluid. No intraperitoneal free gas. No organized fluid collection. Musculoskeletal: Tiny fat containing umbilical hernia. No suspicious lytic or blastic osseous lesions. No acute displaced fracture. Multilevel degenerative changes of the spine. L5-S1 posterolateral fusion. Review of the MIP images confirms the above findings. IMPRESSION: 1. No pulmonary embolus. 2. 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 3. Scattered colonic diverticulosis with no acute diverticulitis. 4. Degenerative uterine fibroid. 5. Otherwise no acute intra-abdominal or intrapelvic abnormality. Electronically Signed   By: Tish Frederickson M.D.   On: 02/09/2021 16:21   DG Chest Port 1 View  Result Date: 02/09/2021 CLINICAL DATA:  Shortness of breath EXAM: PORTABLE CHEST 1 VIEW COMPARISON:  06/25/2020 FINDINGS: Heart size upper  limits of normal. No pulmonary vascular congestion. Lungs clear. IMPRESSION: Borderline cardiomegaly. Electronically Signed   By: Acquanetta Belling M.D.   On: 02/09/2021 14:07    EKG: Independently reviewed.  Assessment/Plan Principal Problem:   Symptomatic anemia Active Problems:   Diabetes mellitus (HCC)   Gastrointestinal hemorrhage with melena   History of DVT (deep vein thrombosis)   HTN (hypertension)    1. GIB with melena, symptomatic anemia - 1. Anemia likely ABLA on top of iron def 2. GIB  ? NSAID ulcer, but whatever is causing it is going to be worsened by the fact that she remains on eliquis. 3. Transfuse 2u PRBC 4. Stop Xarelto 5. Clear liquid diet 6. Morphine PRN pain 7. No NSAIDS 8. PPI bolus and gtt 9. Tele monitor 10. GI to see in AM 2. H/o DVT/PE - 1. Stop Xarelto 2. Was provoked in setting of COVID-19 illness Jan 21 3. DM2 - 1. Hold home hypoglycemics 2. Sensitive SSI Q4H 4. HTN - 1. Med rec pending 2. Resume in AM depending on how she does over night BP wise  DVT prophylaxis: SCDs Code Status: Full Family Communication: No family in room Disposition Plan: Home after GIB work up, anemia improvement Consults called: Message sent to Dr. Adela Lank Admission status: Admit to inpatient  Severity of Illness: The appropriate patient status for this patient is INPATIENT. Inpatient status is judged to be reasonable and necessary in order to provide the required intensity of service to ensure the patient's safety. The patient's presenting symptoms, physical exam findings, and initial radiographic and laboratory data in the context of their chronic comorbidities is felt to place them at high risk for further clinical deterioration. Furthermore, it is not anticipated that the patient will be medically stable for discharge from the hospital within 2 midnights of admission. The following factors support the patient status of inpatient.   IP status due to GIB with symptomatic anemia, complicated by pt being on eliquis.   * I certify that at the point of admission it is my clinical judgment that the patient will require inpatient hospital care spanning beyond 2 midnights from the point of admission due to high intensity of service, high risk for further deterioration and high frequency of surveillance required.*    Autumn Barton M. DO [facility]  How to contact the [facility] Attending or Consulting provider 7A - 7P or covering provider during after hours 7P -7A, for  this patient?  1. Check the care team in [facility] and look for a) attending/consulting [facility] provider listed and b) the [facility] team listed 2. Log into [email or web address]  Amion Physician Scheduling and messaging for groups and whole hospitals  On call and physician scheduling software for group practices, residents, hospitalists and other medical providers for call, clinic, rotation and shift schedules. On[employer] is a hospital-wide system for scheduling doctors and paging doctors on call. EasyPlot is for scientific plotting and data analysis.  [email or web address]  and use [facility]'s universal password to access. If you do not have the password, please contact the hospital operator.  3. Locate the [facility] provider you are looking for under [facility] and page to a number that you can be directly reached. 4. If you still have difficulty reaching the provider, please page the [facility] (Director on Call) for the Hospitalists listed on amion for assistance.  02/09/2021, 9:45 PM

## 2021-02-09 NOTE — ED Triage Notes (Signed)
Emergency Medicine Provider Triage Evaluation Note  Autumn Barton , a 75 y.o. female  was evaluated in triage.  Pt complains of cp x 1 week, and sob worsening for 3 days worse with exertion and lying back no peripheral edema. New cough today, abdominal pain and swelling Review of Systems  Positive: sob Negative: peroipheral edema  Physical Exam  BP (!) 146/81 (BP Location: Left Arm)   Pulse 87   Temp 98.7 F (37.1 C) (Oral)   Resp 20   Ht 5\' 2"  (1.575 m)   Wt 102.1 kg   SpO2 98%   BMI 41.15 kg/m  Gen:   Awake, no distress   HEENT:  Atraumatic  Resp:  dyspmeic Cardiac:  Normal rate  Abd:   Nondistended, nontender  MSK:   Moves extremities without difficulty  Neuro:  Speech clear   Medical Decision Making  Medically screening exam initiated at 1:10 PM.  Appropriate orders placed.  Autumn Barton was informed that the remainder of the evaluation will be completed by another provider, this initial triage assessment does not replace that evaluation, and the importance of remaining in the ED until their evaluation is complete.  Clinical Impression  Sob, r/o cardio pulm cause. Labs/ imaging intitated   Derry Skill, PA-C 02/09/21 1313

## 2021-02-09 NOTE — ED Notes (Signed)
carelink called for consult 

## 2021-02-09 NOTE — ED Notes (Signed)
ED Provider at bedside. 

## 2021-02-09 NOTE — ED Provider Notes (Signed)
[facility] EMERGENCY DEPARTMENT Provider Note   CSN: [id number] Arrival date & time: 02/09/21  1220     History Chief Complaint  Patient presents with  . Shortness of Breath    Autumn Barton is a 75 y.o. female presenting to ED with abdominal pain, shortness of breath. 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  She also has a history of iron deficiency anemia, and gets iron infusions for [facility].  She presents ED complaining of left upper quadrant and left lower chest pain, worsening for the past 3 days.  She states she has been having this pain for many months, and has been worked up as an outpatient for it, and is supposed to see a GI doctor in a week, but came into the ED because she cannot stand the pain today.  She feels very short of breath and winded today.  She said like when she was walking around the house she felt like she was "going to fall out".  She has not felt short of breath before.  She does report she has had black tarry stool for 3 weeks.    HPI     Past Medical History:  Diagnosis Date  . Asthma   . Diabetes mellitus     Patient Active Problem List   Diagnosis Date Noted  . Other forms of dyspnea 06/19/2020  . Moderate persistent asthma 05/08/2020  . Perennial allergic rhinitis 05/08/2020  . Allergic conjunctivitis 05/08/2020  . Diabetes mellitus (HCC)     Past Surgical History:  Procedure Laterality Date  . BACK SURGERY  2020  . COSMETIC SURGERY    . TOTAL KNEE ARTHROPLASTY       OB History   No obstetric history on file.     Family History  Problem Relation Age of Onset  . Eczema Son     Social History   Tobacco Use  . Smoking status: Never Smoker  . Smokeless tobacco: Never Used  Vaping Use  . Vaping Use: Never used  Substance Use Topics  . Alcohol use: No  . Drug use: No    Home Medications Prior to Admission medications   Medication Sig Start Date  End Date Taking? Authorizing Provider  albuterol (PROAIR HFA) 108 (90 Base) MCG/ACT inhaler Inhale 2 puffs into the lungs every 4 (four) hours as needed for wheezing or shortness of breath. 09/01/20   Bobbitt, Heywood Iles, MD  albuterol (VENTOLIN HFA) 108 (90 Base) MCG/ACT inhaler 2 PUFFS EVERY 4-6 HOURS AS NEEDED FOR COUGHING OR WHEEZING SPELLS. USE WITH SPACER. 12/03/20   Bobbitt, Heywood Iles, MD  alendronate (FOSAMAX) 35 MG tablet Take by mouth. 11/03/18   [provider]  amLODipine (NORVASC) 5 MG tablet Take 1 tablet by mouth daily. 07/06/19   [provider]  apixaban (ELIQUIS) 5 MG TABS tablet 2 tab twice daily x 5 days and then 1 tab twice daily 11/19/19   [provider]  Azelastine & Fluticasone 137 & 50 MCG/ACT THPK Place 1 spray into the nose 2 (two) times daily as needed. 05/08/20   Bobbitt, Heywood Iles, MD  celecoxib (CELEBREX) 200 MG capsule Take 200 mg by mouth daily. 08/06/20   [provider]  diclofenac Sodium (VOLTAREN) 1 % GEL Apply 2 g topically 4 (four) times daily. 05/23/20   [provider]  gabapentin (NEURONTIN) 100 MG capsule Take 100 mg by mouth 2 (  two) times daily. 08/07/20   [provider]  HYDROcodone-acetaminophen (NORCO/VICODIN) 5-325 MG tablet Take by mouth as needed. 08/27/20   [provider]  hydrOXYzine (ATARAX/VISTARIL) 10 MG tablet Take 10 mg by mouth as needed. 08/23/20   [provider]  methocarbamol (ROBAXIN) 500 MG tablet Take 500 mg by mouth as needed. 08/22/20   [provider]  montelukast (SINGULAIR) 10 MG tablet Take 10 mg by mouth daily. 08/04/20   [provider]  Olopatadine HCl (PATADAY) 0.2 % SOLN Apply 1 drop to eye daily as needed. 05/08/20   Bobbitt, Heywood Iles, MD  omeprazole (PRILOSEC) 40 MG capsule Take 40 mg by mouth daily.    [provider]  sitaGLIPtin (JANUVIA) 100 MG tablet Take 100 mg by mouth daily. 07/21/20   [provider]   SYMBICORT 160-4.5 MCG/ACT inhaler INHALE 2 PUFFS INTO THE LUNGS IN THE MORNING AND AT BEDTIME. USE WITH SPACER. RINSE, GARGLE AND SPIT OUT AFTER USE. 11/20/20   Bobbitt, Heywood Iles, MD  Tiotropium Bromide Monohydrate (SPIRIVA RESPIMAT) 1.25 MCG/ACT AERS Inhale 2 puffs into the lungs daily. Patient not taking: Reported on 09/01/2020 06/19/20   Bobbitt, Heywood Iles, MD  Vitamin D, Ergocalciferol, (DRISDOL) 1.25 MG (50000 UT) CAPS capsule Take 50,000 Units by mouth every 7 (seven) days.  09/23/16   [provider]  zolpidem (AMBIEN) 5 MG tablet Take 5 mg by mouth at bedtime as needed. 08/23/20   [provider]    Allergies    Adhesive [tape], Aspirin, and Ibuprofen  Review of Systems   Review of Systems  Constitutional: Negative for chills and fever.  Eyes: Negative for pain and visual disturbance.  Respiratory: Positive for shortness of breath. Negative for cough.   Cardiovascular: Positive for chest pain. Negative for palpitations.  Gastrointestinal: Positive for abdominal pain. Negative for vomiting.  Genitourinary: Negative for dysuria and hematuria.  Musculoskeletal: Negative for arthralgias and back pain.  Skin: Negative for rash and wound. Color change:    Neurological: Positive for light-headedness. Negative for syncope.  All other systems reviewed and are negative.   Physical Exam Updated Vital Signs BP 130/70   Pulse 78   Temp 98.7 F (37.1 C) (Oral)   Resp 15   Ht 5\' 2"  (1.575 m)   Wt 102.1 kg   SpO2 97%   BMI 41.15 kg/m   Physical Exam Constitutional:      General: She is not in acute distress.    Appearance: She is obese.  HENT:     Head: Normocephalic and atraumatic.  Eyes:     Conjunctiva/sclera: Conjunctivae normal.     Pupils: Pupils are equal, round, and reactive to light.  Cardiovascular:     Rate and Rhythm: Normal rate and regular rhythm.  Pulmonary:     Effort: Pulmonary effort is normal. No respiratory distress.  Chest:      Chest wall: Tenderness present.  Abdominal:     General: There is no distension.     Tenderness: There is abdominal tenderness in the left upper quadrant.     Hernia: Hernia: f left.  Skin:    General: Skin is warm and dry.  Neurological:     General: No focal deficit present.     Mental Status: She is alert. Mental status is at baseline.  Psychiatric:        Mood and Affect: Mood normal.        Behavior: Behavior normal.     ED Results /  Procedures / Treatments   Labs (all labs ordered are listed, but only abnormal results are displayed) Labs Reviewed  CBC WITH DIFFERENTIAL/PLATELET - Abnormal; Notable for the following components:      Result Value   Hemoglobin 7.6 (*)    HCT 27.3 (*)    MCV 69.6 (*)    MCH 19.4 (*)    MCHC 27.8 (*)    RDW 20.8 (*)    nRBC 0.9 (*)    All other components within normal limits  COMPREHENSIVE METABOLIC PANEL - Abnormal; Notable for the following components:   Sodium 133 (*)    Glucose, Bld 123 (*)    Creatinine, Ser 1.31 (*)    Total Bilirubin 0.2 (*)    GFR, Estimated 42 (*)    All other components within normal limits  RESP PANEL BY RT-PCR (FLU A&B, COVID) ARPGX2  BRAIN NATRIURETIC PEPTIDE  LIPASE, BLOOD  URINALYSIS, ROUTINE W REFLEX MICROSCOPIC  TROPONIN I (HIGH SENSITIVITY)  TROPONIN I (HIGH SENSITIVITY)    EKG None  Radiology DG Chest Port 1 View  Result Date: 02/09/2021 CLINICAL DATA:  Shortness of breath EXAM: PORTABLE CHEST 1 VIEW COMPARISON:  06/25/2020 FINDINGS: Heart size upper limits of normal. No pulmonary vascular congestion. Lungs clear. IMPRESSION: Borderline cardiomegaly. Electronically Signed   By: Acquanetta Belling M.D.   On: 02/09/2021 14:07    Procedures Procedures   Medications Ordered in ED Medications - No data to display  ED Course  I have reviewed the triage vital signs and the nursing notes.  Pertinent labs & imaging results that were available during my care of the patient were reviewed by me and  considered in my medical decision making (see chart for details).  This patient complains of lightheadedness, left sided chest pain.  his involves an extensive number of treatment options, and is a complaint that carries with it a high risk of complications and morbidity.  The differential diagnosis includes anemia vs PE vs infection vs other  Hx of iron deficiency anemia, low B12 levels - gets treatment with [facility].  Dark stools x 3 weeks, hemoccult positive here.  May have chronic gi bleed (?).  Hgb 7.6 today, has been this low in the past several months around her treatments.    The location of her pain is difficult to pinpoint - may be left lower chest or upper abdomen.  She has both respiratory and GI symptoms.  With her hx of PE and her dyspnea, I do feel we need a repeat CT PE.  Her GFR is 42 and above our threshold for contrast study.  We'll also add a CT abdomen.  I ordered, reviewed, and interpreted labs. Trop 6 - doubt ACS.  Covid/flu negative. I ordered medication Iv morphine for pain I ordered imaging studies which included CT PE and CT abdomen I independently visualized and interpreted imaging which showed no life-threatening abnormalities, and the monitor tracing which showed NSR  Patient signed out at 330 pm to Dr Madilyn Hook EDP with plan to follow up on CT scans and reassess patient's hemodynamics.  We may consider transfer for transfusion vs close outpatient hematology follow up.       Final Clinical Impression(s) / ED Diagnoses Final diagnoses:  None    Rx / DC Orders ED Discharge Orders    None       Esmeralda Blanford, Kermit Balo, MD 02/09/21 [phone]

## 2021-02-10 ENCOUNTER — Inpatient Hospital Stay (HOSPITAL_COMMUNITY): Payer: Medicare HMO

## 2021-02-10 DIAGNOSIS — I340 Nonrheumatic mitral (valve) insufficiency: Secondary | ICD-10-CM

## 2021-02-10 DIAGNOSIS — R195 Other fecal abnormalities: Secondary | ICD-10-CM

## 2021-02-10 DIAGNOSIS — D509 Iron deficiency anemia, unspecified: Secondary | ICD-10-CM | POA: Diagnosis not present

## 2021-02-10 DIAGNOSIS — R0600 Dyspnea, unspecified: Secondary | ICD-10-CM | POA: Diagnosis not present

## 2021-02-10 LAB — CBC
HCT: 32.4 % — ABNORMAL LOW (ref 36.0–46.0)
HCT: 30.7 % — ABNORMAL LOW (ref 36.0–46.0)
HCT: 31.2 % — ABNORMAL LOW (ref 36.0–46.0)
Hemoglobin: 9.6 g/dL — ABNORMAL LOW (ref 12.0–15.0)
Hemoglobin: 8.9 g/dL — ABNORMAL LOW (ref 12.0–15.0)
Hemoglobin: 9 g/dL — ABNORMAL LOW (ref 12.0–15.0)
MCH: 21.4 pg — ABNORMAL LOW (ref 26.0–34.0)
MCH: 21.1 pg — ABNORMAL LOW (ref 26.0–34.0)
MCH: 21 pg — ABNORMAL LOW (ref 26.0–34.0)
MCHC: 29.6 g/dL — ABNORMAL LOW (ref 30.0–36.0)
MCHC: 29 g/dL — ABNORMAL LOW (ref 30.0–36.0)
MCHC: 28.8 g/dL — ABNORMAL LOW (ref 30.0–36.0)
MCV: 72.2 fL — ABNORMAL LOW (ref 80.0–100.0)
MCV: 72.7 fL — ABNORMAL LOW (ref 80.0–100.0)
MCV: 72.7 fL — ABNORMAL LOW (ref 80.0–100.0)
Platelets: 294 10*3/uL (ref 150–400)
Platelets: 299 10*3/uL (ref 150–400)
Platelets: 312 10*3/uL (ref 150–400)
RBC: 4.49 MIL/uL (ref 3.87–5.11)
RBC: 4.22 MIL/uL (ref 3.87–5.11)
RBC: 4.29 MIL/uL (ref 3.87–5.11)
RDW: 20 % — ABNORMAL HIGH (ref 11.5–15.5)
RDW: 19.8 % — ABNORMAL HIGH (ref 11.5–15.5)
RDW: 20.1 % — ABNORMAL HIGH (ref 11.5–15.5)
WBC: 6.3 10*3/uL (ref 4.0–10.5)
WBC: 5.6 10*3/uL (ref 4.0–10.5)
WBC: 6.6 10*3/uL (ref 4.0–10.5)
nRBC: 0.6 % — ABNORMAL HIGH (ref 0.0–0.2)
nRBC: 0.4 % — ABNORMAL HIGH (ref 0.0–0.2)
nRBC: 0.8 % — ABNORMAL HIGH (ref 0.0–0.2)

## 2021-02-10 LAB — GLUCOSE, CAPILLARY
Glucose-Capillary: 120 mg/dL — ABNORMAL HIGH (ref 70–99)
Glucose-Capillary: 122 mg/dL — ABNORMAL HIGH (ref 70–99)
Glucose-Capillary: 114 mg/dL — ABNORMAL HIGH (ref 70–99)
Glucose-Capillary: 136 mg/dL — ABNORMAL HIGH (ref 70–99)
Glucose-Capillary: 104 mg/dL — ABNORMAL HIGH (ref 70–99)

## 2021-02-10 LAB — BASIC METABOLIC PANEL
Anion gap: 8 (ref 5–15)
BUN: 11 mg/dL (ref 8–23)
CO2: 25 mmol/L (ref 22–32)
Calcium: 9.1 mg/dL (ref 8.9–10.3)
Chloride: 105 mmol/L (ref 98–111)
Creatinine, Ser: 1.38 mg/dL — ABNORMAL HIGH (ref 0.44–1.00)
GFR, Estimated: 40 mL/min — ABNORMAL LOW (ref >60)
Glucose, Bld: 116 mg/dL — ABNORMAL HIGH (ref 70–99)
Potassium: 3.9 mmol/L (ref 3.5–5.1)
Sodium: 138 mmol/L (ref 135–145)

## 2021-02-10 LAB — TSH: TSH: 2.88 u[IU]/mL (ref 0.350–4.500)

## 2021-02-10 LAB — ECHOCARDIOGRAM COMPLETE
Area-P 1/2: 3.38 cm2
Height: 62 in
S' Lateral: 3.1 cm
Weight: 3531.2 oz

## 2021-02-10 MED ORDER — PEG-KCL-NACL-NASULF-NA ASC-C 100 G PO SOLR: 1.0000 | Freq: Once | ORAL | Status: DC

## 2021-02-10 MED ORDER — BISACODYL 5 MG PO TBEC
20.0000 mg | DELAYED_RELEASE_TABLET | Freq: Once | ORAL | Status: AC
  Administered 2021-02-10: 20 mg via ORAL
  Filled 2021-02-10: qty 4

## 2021-02-10 MED ORDER — METOCLOPRAMIDE HCL 5 MG/ML IJ SOLN
10.0000 mg | Freq: Once | INTRAMUSCULAR | Status: AC
  Administered 2021-02-10: 10 mg via INTRAVENOUS
  Filled 2021-02-10: qty 2

## 2021-02-10 MED ORDER — PEG-KCL-NACL-NASULF-NA ASC-C 100 G PO SOLR
0.5000 | Freq: Once | ORAL | Status: AC
  Administered 2021-02-10: 100 g via ORAL
  Filled 2021-02-10: qty 1

## 2021-02-10 MED ORDER — PANTOPRAZOLE SODIUM 40 MG PO TBEC
40.0000 mg | DELAYED_RELEASE_TABLET | Freq: Two times a day (BID) | ORAL | Status: DC
  Administered 2021-02-10 – 2021-02-12 (×4): 40 mg via ORAL
  Filled 2021-02-10 (×4): qty 1

## 2021-02-10 NOTE — Progress Notes (Signed)
PROGRESS NOTE    Autumn Barton  JXB:[id number] DOB: 05/07/1946 DOA: 02/09/2021 PCP: Milta DeitersArledge, Leslie, FNP   Brief Narrative:  HPI: Autumn Barton is a 75 y.o. female with medical history significant of DM2, asthma.  Pt on Xarelto for DVT/PE back in Jan 2021 in setting of COVID-19 illness.  Pt presents to ED with c/o SOB, Abd pain / CP.  LUQ  And LL chest pain.  Symptoms onset some months ago but worsening for the past 3 days.  Worked up as outpt, noted to have iron def anemia, [facility] but came in to ED due to worsening symptoms.  SOB worse with exertion.  Symptoms are severe.  She has had melena for the past 3 weeks she relates.  No fevers, chills, cough, emesis.  Does have somewhat chronic use of NSAIDS   ED Course: HGB 7.6 today, hemoccult positive.  Anemia looks microcytic and hypochromic.  [facility] to [facility] for admission.  CTA chest/abd/pelvis: 1) no acute findings 2) specifically no PE.  Trop neg.  Hemoccult positive.  Assessment & Plan:   Principal Problem:   Symptomatic anemia Active Problems:   Diabetes mellitus (HCC)   Gastrointestinal hemorrhage with melena   History of DVT (deep vein thrombosis)   HTN (hypertension)  Symptomatic anemia/presumed upper GI bleed/acute blood loss anemia: Patient has history of taking NSAIDs couple of times a week for about a year for her back pain.  Has been having melena.  Came in with hemoglobin of 7.6.  Was transfused 2 unit of PRBC.  Patient feels better today.  GI on board.  Plan for EGD and colonoscopy.  Appreciate GI help.  Continue PPI.  Monitor H&H later and tomorrow.  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  No echo in chart.  With having shortness of breath and this finding of cyst, will proceed with transthoracic echo.  History of DVT/PE: Was on Xarelto which is on hold due to upper GI bleed.  Type 2 diabetes mellitus: Hemoglobin A1c 6.4.   Blood sugar controlled.  Hold home hypoglycemics.  Continue SSI.  Essential hypertension: Controlled.  Continue amlodipine.  DVT prophylaxis: SCDs Start: 02/09/21 2134   Code Status: Full Code  Family Communication:  None present at bedside.  Plan of care discussed with patient in length and he verbalized understanding and agreed with it.  Status is: Inpatient  Remains inpatient appropriate because:Ongoing diagnostic testing needed not appropriate for outpatient work up   Dispo: The patient is from: Home              Anticipated d/c is to: Home              Patient currently is not medically stable to d/c.   Difficult to place patient No        Estimated body mass index is 40.37 kg/m as calculated from the following:   Height as of this encounter: 5\' 2"  (1.575 m).   Weight as of this encounter: 100.1 kg.      Nutritional status:               Consultants:   GI  Procedures:   None  Antimicrobials:  Anti-infectives (From admission, onward)   None         Subjective: Seen and examined.  Feels better than yesterday but is still weak and having exertional shortness of breath.  Objective: Vitals:   02/10/21 0215  02/10/21 0300 02/10/21 0812 02/10/21 0825  BP: 126/70 (!) 141/93    Pulse: 83 85    Resp: 14 16    Temp: 98.4 F (36.9 C) 98.7 F (37.1 C)  97.7 F (36.5 C)  TempSrc: Oral Oral  Oral  SpO2: 100% 100% 99%   Weight:      Height:        Intake/Output Summary (Last 24 hours) at 02/10/2021 1016 Last data filed at 02/09/2021 2310 Gross per 24 hour  Intake 415 ml  Output --  Net 415 ml   Filed Weights   02/09/21 1232 02/09/21 2120  Weight: 102.1 kg 100.1 kg    Examination:  General exam: Appears calm and comfortable  Respiratory system: Clear to auscultation. Respiratory effort normal. Cardiovascular system: S1 & S2 heard, RRR. No JVD, murmurs, rubs, gallops or clicks. No pedal edema. Gastrointestinal system: Abdomen is  nondistended, soft and nontender. No organomegaly or masses felt. Normal bowel sounds heard. Central nervous system: Alert and oriented. No focal neurological deficits. Extremities: Symmetric 5 x 5 power. Skin: No rashes, lesions or ulcers Psychiatry: Judgement and insight appear normal. Mood & affect appropriate.    Data Reviewed: I have personally reviewed following labs and imaging studies  CBC: Recent Labs  Lab 02/09/21 1315 02/10/21 0624  WBC 7.0 6.3  NEUTROABS 4.7  --   HGB 7.6* 9.6*  HCT 27.3* 32.4*  MCV 69.6* 72.2*  PLT 339 294   Basic Metabolic Panel: Recent Labs  Lab 02/09/21 1315 02/10/21 0624  NA 133* 138  K 4.0 3.9  CL 103 105  CO2 22 25  GLUCOSE 123* 116*  BUN 14 11  CREATININE 1.31* 1.38*  CALCIUM 8.9 9.1   GFR: Estimated Creatinine Clearance: 38.4 mL/min (A) (by C-G formula based on SCr of 1.38 mg/dL (H)). Liver Function Tests: Recent Labs  Lab 02/09/21 1315  AST 21  ALT 10  ALKPHOS 51  BILITOT 0.2*  PROT 7.8  ALBUMIN 3.8   Recent Labs  Lab 02/09/21 1315  LIPASE 41   No results for input(s): AMMONIA in the last 168 hours. Coagulation Profile: No results for input(s): INR, PROTIME in the last 168 hours. Cardiac Enzymes: No results for input(s): CKTOTAL, CKMB, CKMBINDEX, TROPONINI in the last 168 hours. BNP (last 3 results) No results for input(s): PROBNP in the last 8760 hours. HbA1C: Recent Labs    02/09/21 2140  HGBA1C 6.4*   CBG: Recent Labs  Lab 02/09/21 1821 02/10/21 0347 02/10/21 0826  GLUCAP 92 120* 122*   Lipid Profile: No results for input(s): CHOL, HDL, LDLCALC, TRIG, CHOLHDL, LDLDIRECT in the last 72 hours. Thyroid Function Tests: No results for input(s): TSH, T4TOTAL, FREET4, T3FREE, THYROIDAB in the last 72 hours. Anemia Panel: No results for input(s): VITAMINB12, FOLATE, FERRITIN, TIBC, IRON, RETICCTPCT in the last 72 hours. Sepsis Labs: No results for input(s): PROCALCITON, LATICACIDVEN in the last 168  hours.  Recent Results (from the past 240 hour(s))  Resp Panel by RT-PCR (Flu A&B, Covid) Nasopharyngeal Swab     Status: None   Collection Time: 02/09/21  1:25 PM   Specimen: Nasopharyngeal Swab; Nasopharyngeal(NP) swabs in vial transport medium  Result Value Ref Range Status   SARS Coronavirus 2 by RT PCR NEGATIVE NEGATIVE Final    Comment: (NOTE) SARS-CoV-2 target nucleic acids are NOT DETECTED.  The SARS-CoV-2 RNA is generally detectable in upper respiratory specimens during the acute phase of infection. The lowest concentration of SARS-CoV-2 viral copies this assay can  detect is 138 copies/mL. A negative result does not preclude SARS-Cov-2 infection and should not be used as the sole basis for treatment or other patient management decisions. A negative result may occur with  improper specimen collection/handling, submission of specimen other than nasopharyngeal swab, presence of viral mutation(s) within the areas targeted by this assay, and inadequate number of viral copies(<138 copies/mL). A negative result must be combined with clinical observations, patient history, and epidemiological information. The expected result is Negative.  Fact Sheet for Patients:  BloggerCourse.com  Fact Sheet for Healthcare Providers:  SeriousBroker.it  This test is no t yet approved or cleared by the [place] FDA and  has been authorized for detection and/or diagnosis of SARS-CoV-2 by FDA under an Emergency Use Authorization (EUA). This EUA will remain  in effect (meaning this test can be used) for the duration of the COVID-19 declaration under Section 564(b)(1) of the Act, 21 U.S.C.section 360bbb-3(b)(1), unless the authorization is terminated  or revoked sooner.       Influenza A by PCR NEGATIVE NEGATIVE Final   Influenza B by PCR NEGATIVE NEGATIVE Final    Comment: (NOTE) The Xpert Xpress SARS-CoV-2/FLU/RSV plus assay is intended  as an aid in the diagnosis of influenza from Nasopharyngeal swab specimens and should not be used as a sole basis for treatment. Nasal washings and aspirates are unacceptable for Xpert Xpress SARS-CoV-2/FLU/RSV testing.  Fact Sheet for Patients: BloggerCourse.com  Fact Sheet for Healthcare Providers: SeriousBroker.it  This test is not yet approved or cleared by the [place] FDA and has been authorized for detection and/or diagnosis of SARS-CoV-2 by FDA under an Emergency Use Authorization (EUA). This EUA will remain in effect (meaning this test can be used) for the duration of the COVID-19 declaration under Section 564(b)(1) of the Act, 21 U.S.C. section 360bbb-3(b)(1), unless the authorization is terminated or revoked.  Performed at [facility], [address] [place], [place] [address]       Radiology Studies: CT Angio Chest PE W and/or Wo Contrast  Result Date: 02/09/2021 CLINICAL DATA:  Pulmonary embolus suspected. Soreness of breath and sharp pain in left upper quadrant for a week. EXAM: CT ANGIOGRAPHY CHEST CT ABDOMEN AND PELVIS WITH CONTRAST TECHNIQUE: Multidetector CT imaging of the chest was performed using the standard protocol during bolus administration of intravenous contrast. Multiplanar CT image reconstructions and MIPs were obtained to evaluate the vascular anatomy. Multidetector CT imaging of the abdomen and pelvis was performed using the standard protocol during bolus administration of intravenous contrast. CONTRAST:  OMNIPAQUE IOHEXOL 350 MG/ML SOLN COMPARISON:  CT chest 11/12/2020 FINDINGS: CTA CHEST FINDINGS Cardiovascular: Satisfactory opacification of the pulmonary arteries to the segmental level. No evidence of pulmonary embolism. The main pulmonary artery measures at the upper limits of normal. Normal heart size. No significant pericardial effusion. The thoracic aorta is normal in caliber. Mild  atherosclerotic plaque of the thoracic aorta. Mediastinum/Nodes: Redemonstration of a precardiac/upper abdominal fluid density lesion measuring approximately 5 x 3.5 x 5.5 cm (11:11, 15:69) likely representing a pericardial cyst. No enlarged mediastinal, hilar, or axillary lymph nodes. Thyroid gland, trachea, and esophagus demonstrate no significant findings. Lungs/Pleura: Expiratory phase of respiration. Subsegmental atelectasis. Mosaic attenuation due to low lung volumes. Nonspecific thin walled cystic lesion within the right lower lobe. No definite pulmonary mass. No pulmonary nodule. No pleural effusion. No pneumothorax. Pleural effusion or pneumothorax. Musculoskeletal: No chest wall abnormality. Lower thoracic vertebral body hemangioma noted. No acute or significant osseous findings.  Review of the MIP images confirms the above findings. CT ABDOMEN and PELVIS FINDINGS Hepatobiliary: No focal liver abnormality. No gallstones, gallbladder wall thickening, or pericholecystic fluid. No biliary dilatation. Pancreas: No focal lesion. Normal pancreatic contour. No surrounding inflammatory changes. No main pancreatic ductal dilatation. Spleen: Normal in size without focal abnormality. Adrenals/Urinary Tract: Adrenal glands are unremarkable. Pericentimeter fluid density lesions likely represent simple renal cysts. Otherwise the kidneys are normal, without renal calculi, focal lesion, or hydronephrosis. Bladder is unremarkable. Stomach/Bowel: Stomach is within normal limits. No evidence of bowel wall thickening or dilatation. The appendix not definitely identified. Scattered colonic diverticulosis. Vascular/Lymphatic: No abdominal aorta or iliac aneurysm. Mild atherosclerotic plaque of the aorta and its branches. No abdominal, pelvic, or inguinal lymphadenopathy. Reproductive: Coarsely calcified lesion within the uterus likely represents a uterine fibroid. Uterus and bilateral adnexa are unremarkable. Other: No  intraperitoneal free fluid. No intraperitoneal free gas. No organized fluid collection. Musculoskeletal: Tiny fat containing umbilical hernia. No suspicious lytic or blastic osseous lesions. No acute displaced fracture. Multilevel degenerative changes of the spine. L5-S1 posterolateral fusion. Review of the MIP images confirms the above findings. IMPRESSION: 1. No pulmonary embolus. 2. 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 3. Scattered colonic diverticulosis with no acute diverticulitis. 4. Degenerative uterine fibroid. 5. Otherwise no acute intra-abdominal or intrapelvic abnormality. Electronically Signed   By: Tish Frederickson M.D.   On: 02/09/2021 16:21   CT ABDOMEN PELVIS W CONTRAST  Result Date: 02/09/2021 CLINICAL DATA:  Pulmonary embolus suspected. Soreness of breath and sharp pain in left upper quadrant for a week. EXAM: CT ANGIOGRAPHY CHEST CT ABDOMEN AND PELVIS WITH CONTRAST TECHNIQUE: Multidetector CT imaging of the chest was performed using the standard protocol during bolus administration of intravenous contrast. Multiplanar CT image reconstructions and MIPs were obtained to evaluate the vascular anatomy. Multidetector CT imaging of the abdomen and pelvis was performed using the standard protocol during bolus administration of intravenous contrast. CONTRAST:  OMNIPAQUE IOHEXOL 350 MG/ML SOLN COMPARISON:  CT chest 11/12/2020 FINDINGS: CTA CHEST FINDINGS Cardiovascular: Satisfactory opacification of the pulmonary arteries to the segmental level. No evidence of pulmonary embolism. The main pulmonary artery measures at the upper limits of normal. Normal heart size. No significant pericardial effusion. The thoracic aorta is normal in caliber. Mild atherosclerotic plaque of the thoracic aorta. Mediastinum/Nodes: Redemonstration of a precardiac/upper abdominal fluid density lesion measuring approximately 5 x 3.5 x 5.5 cm (11:11, 15:69) likely representing a  pericardial cyst. No enlarged mediastinal, hilar, or axillary lymph nodes. Thyroid gland, trachea, and esophagus demonstrate no significant findings. Lungs/Pleura: Expiratory phase of respiration. Subsegmental atelectasis. Mosaic attenuation due to low lung volumes. Nonspecific thin walled cystic lesion within the right lower lobe. No definite pulmonary mass. No pulmonary nodule. No pleural effusion. No pneumothorax. Pleural effusion or pneumothorax. Musculoskeletal: No chest wall abnormality. Lower thoracic vertebral body hemangioma noted. No acute or significant osseous findings. Review of the MIP images confirms the above findings. CT ABDOMEN and PELVIS FINDINGS Hepatobiliary: No focal liver abnormality. No gallstones, gallbladder wall thickening, or pericholecystic fluid. No biliary dilatation. Pancreas: No focal lesion. Normal pancreatic contour. No surrounding inflammatory changes. No main pancreatic ductal dilatation. Spleen: Normal in size without focal abnormality. Adrenals/Urinary Tract: Adrenal glands are unremarkable. Pericentimeter fluid density lesions likely represent simple renal cysts. Otherwise the kidneys are normal, without renal calculi, focal lesion, or hydronephrosis. Bladder is unremarkable. Stomach/Bowel: Stomach is within normal limits. No evidence of bowel wall thickening or dilatation. The appendix  not definitely identified. Scattered colonic diverticulosis. Vascular/Lymphatic: No abdominal aorta or iliac aneurysm. Mild atherosclerotic plaque of the aorta and its branches. No abdominal, pelvic, or inguinal lymphadenopathy. Reproductive: Coarsely calcified lesion within the uterus likely represents a uterine fibroid. Uterus and bilateral adnexa are unremarkable. Other: No intraperitoneal free fluid. No intraperitoneal free gas. No organized fluid collection. Musculoskeletal: Tiny fat containing umbilical hernia. No suspicious lytic or blastic osseous lesions. No acute displaced fracture.  Multilevel degenerative changes of the spine. L5-S1 posterolateral fusion. Review of the MIP images confirms the above findings. IMPRESSION: 1. No pulmonary embolus. 2. 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 3. Scattered colonic diverticulosis with no acute diverticulitis. 4. Degenerative uterine fibroid. 5. Otherwise no acute intra-abdominal or intrapelvic abnormality. Electronically Signed   By: Tish Frederickson M.D.   On: 02/09/2021 16:21   DG Chest Port 1 View  Result Date: 02/09/2021 CLINICAL DATA:  Shortness of breath EXAM: PORTABLE CHEST 1 VIEW COMPARISON:  06/25/2020 FINDINGS: Heart size upper limits of normal. No pulmonary vascular congestion. Lungs clear. IMPRESSION: Borderline cardiomegaly. Electronically Signed   By: Acquanetta Belling M.D.   On: 02/09/2021 14:07    Scheduled Meds: . amLODipine  5 mg Oral q morning  . cyanocobalamin  1,000 mcg Subcutaneous Q30 days  . insulin aspart  0-9 Units Subcutaneous Q4H  . mometasone-formoterol  2 puff Inhalation BID  . montelukast  10 mg Oral q morning  . pantoprazole  40 mg Oral BID  . [START ON 02/15/2021] Vitamin D (Ergocalciferol)  50,000 Units Oral Q Sun   Continuous Infusions: . sodium chloride 10 mL/hr at 02/09/21 1730     LOS: 1 day   Time spent: 35 minutes   Hughie Closs, MD [facility]  02/10/2021, 10:16 AM   To contact the attending provider between 7A-7P or the covering provider during after hours 7P-7A, please log into the web site [email or web address].

## 2021-02-10 NOTE — Consult Note (Addendum)
[facility] Consult: 8:35 AM 02/10/2021  LOS: 1 day    Referring Provider: Dr Jacqulyn Bath  Primary Care Physician:  Milta Deiters, FNP Primary Gastroenterologist:  Gentry Fitz.       Reason for Consultation:  IDA.     HPI: Autumn Barton is a 75 y.o. female.  Past medical history PE 10/2019 in setting of COVID-19.  Chronic Xarelto.  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  Stage 3b CKD.  DM 2.  Warthin's tumor of parotid gland.  Morbid obesity.  Sleep apnea, not on CPAP.   Colonoscopy and EGD in 2020 at [facility].  Patient has no recall of polyps, AVMs, ulcers.  She recalls that they did not see a source for anemia.  VCE contemplated but never pursued. Patient was supposed to have knee replacement in December but anemia prevented surgery and she was referred back to GI.  Started on B12 shots monthly in February.  She had been having fairly profound fatigue and dyspnea just moving around her house or getting up from out of a chair.  This seemed to improve a little bit after her first B12 shot but symptoms recurred.  This past Sunday her daughter gave her her B12 shot and she noticed no improvement in her fatigue/weakness/dyspnea.  Also having TTP in her abdomen but no pain at rest.  Great appetite.  No nausea vomiting.  No pyrosis.  No dysphagia.  Stools have been dark for 2 to 3 weeks but no obvious blood.  She had never received blood transfusions.  Denies excessive, unusual bleeding or bruising.  Patient was taking 2 Aleve daily but advised to stop this last week so it has been a week since she had it.  Hgb 7.6 >> 2 PRBC >> 9.6.   Troponins, BNP WNL. FOBT +.   CTAP/chest: No PE.  Stable pericardial cyst.   Nonspecific cystic lesion in RLL lung.  Scattered colon tics, no diverticulitis.  Degenerative uterine fibroid.  Multilevel degenerative spine disease. Protonix drip initiated.  Omeprazole 20 mg/daily at home.  Also takes a multivitamin once a week, this may contain iron.  As far she knows family history negative for sickle cell disease or trait, thalassemia, anemias, ulcer disease, bleeding disorders. She lives in [place].  Does not and never has consumed alcohol.   Past Medical History:  Diagnosis Date  . Asthma   . Diabetes mellitus     Past Surgical History:  Procedure Laterality Date  . BACK SURGERY  2020  . COSMETIC SURGERY    . TOTAL KNEE ARTHROPLASTY      Prior to Admission medications   Medication Sig Start Date End Date Taking? Authorizing Provider  acetaminophen (TYLENOL) 500 MG tablet Take 1,000 mg by mouth every 6 (six) hours as needed for headache (pain).   Yes [provider]  albuterol (PROAIR HFA) 108 (90 Base) MCG/ACT inhaler Inhale 2 puffs into the lungs every 4 (four) hours as needed for  wheezing or shortness of breath. 09/01/20  Yes Bobbitt, Heywood Iles, MD  amLODipine (NORVASC) 5 MG tablet Take 5 mg by mouth every morning. 07/06/19  Yes [provider]  cyanocobalamin (,VITAMIN B-12,) 1000 MCG/ML injection Inject 1,000 mcg into the skin every 30 (thirty) days. 12/12/20  Yes [provider]  diclofenac Sodium (VOLTAREN) 1 % GEL Apply 2 g topically 4 (four) times daily as needed (pain). 05/23/20  Yes [provider]  fluticasone (FLONASE) 50 MCG/ACT nasal spray Place 2 sprays into both nostrils daily as needed for allergies or rhinitis. 12/16/20  Yes [provider]  hydrOXYzine (ATARAX/VISTARIL) 10 MG tablet Take 10 mg by mouth 3 (three) times daily as needed for itching. 08/23/20  Yes [provider]  lidocaine (LIDODERM) 5 % Place 1 patch onto the skin daily as needed (pain). 01/26/21  Yes [provider]   montelukast (SINGULAIR) 10 MG tablet Take 10 mg by mouth every morning. 08/04/20  Yes [provider]  omeprazole (PRILOSEC) 20 MG capsule Take 20 mg by mouth every morning. 11/26/20  Yes [provider]  rivaroxaban (XARELTO) 20 MG TABS tablet Take 20 mg by mouth every evening.   Yes [provider]  sitaGLIPtin (JANUVIA) 100 MG tablet Take 100 mg by mouth every morning. 07/21/20  Yes [provider]  SYMBICORT 160-4.5 MCG/ACT inhaler INHALE 2 PUFFS INTO THE LUNGS IN THE MORNING AND AT BEDTIME. USE WITH SPACER. RINSE, GARGLE AND SPIT OUT AFTER USE. Patient taking differently: Inhale 2 puffs into the lungs 2 (two) times daily. Use with spacer. Rinse, gargle and spit out after use. 11/20/20  Yes Bobbitt, Heywood Iles, MD  Vitamin D, Ergocalciferol, (DRISDOL) 1.25 MG (50000 UT) CAPS capsule Take 50,000 Units by mouth every Sunday. 09/23/16  Yes [provider]  zolpidem (AMBIEN) 5 MG tablet Take 5 mg by mouth at bedtime as needed for sleep. 08/23/20  Yes [provider]    Scheduled Meds: . amLODipine  5 mg Oral q morning  . cyanocobalamin  1,000 mcg Subcutaneous Q30 days  . insulin aspart  0-9 Units Subcutaneous Q4H  . mometasone-formoterol  2 puff Inhalation BID  . montelukast  10 mg Oral q morning  . [START ON 02/13/2021] pantoprazole  40 mg Intravenous Q12H  . [START ON 02/15/2021] Vitamin D (Ergocalciferol)  50,000 Units Oral Q Sun   Infusions: . sodium chloride 10 mL/hr at 02/09/21 1730  . pantoprozole (PROTONIX) infusion 8 mg/hr (02/09/21 1802)   PRN Meds: sodium chloride, acetaminophen **OR** acetaminophen, albuterol, fluticasone, hydrOXYzine, lidocaine, morphine injection, ondansetron **OR** ondansetron (ZOFRAN) IV, zolpidem   Allergies as of 02/09/2021 - Review Complete 02/09/2021  Allergen Reaction Noted  . Adhesive [tape] Other (See Comments) 11/16/2018  . Aspirin Nausea Only 01/14/2019  . Ibuprofen Nausea Only 01/14/2019     Family History  Problem Relation Age of Onset  . Eczema Son     Social History   Socioeconomic History  . Marital status: Widowed    Spouse name: Not on file  . Number of children: Not on file  . Years of education: Not on file  . Highest education level: Not on file  Occupational History  . Not on file  Tobacco Use  . Smoking status: Never Smoker  . Smokeless tobacco: Never Used  Vaping Use  . Vaping Use: Never used  Substance and Sexual Activity  . Alcohol use: No  . Drug use: No  . Sexual activity: Not on file  Other Topics  Concern  . Not on file  Social History Narrative  . Not on file   Social Determinants of Health   Financial Resource Strain: Not on file  Food Insecurity: Not on file  Transportation Needs: Not on file  Physical Activity: Not on file  Stress: Not on file  Social Connections: Not on file  Intimate Partner Violence: Not on file    REVIEW OF SYSTEMS: Constitutional: Weakness, fatigue as per HPI ENT:  No nose bleeds Pulm: Shortness of breath, dry cough. CV:  No palpitations, no LE edema.  No angina GU:  No hematuria, no frequency GI: See HPI. Heme: See HPI. Transfusions: See HPI. Neuro:  No headaches, no peripheral tingling or numbness.  No seizures, no syncope. Derm:  No itching, no rash or sores.  Endocrine:  No sweats or chills.  No polyuria or dysuria Immunization: Vaccinated in March 2021 with [employer] COVID-19 VAX. Travel:  None beyond local counties in last few months.    PHYSICAL EXAM: Vital signs in last 24 hours: Vitals:   02/10/21 0300 02/10/21 0812  BP: (!) 141/93   Pulse: 85   Resp: 16   Temp: 98.7 F (37.1 C)   SpO2: 100% 99%   Wt Readings from Last 3 Encounters:  02/09/21 100.1 kg  05/08/20 100.6 kg  11/07/19 108.4 kg    General: Obese, elderly, somewhat chronically ill-appearing.  Resting in bed. Head: No facial asymmetry or swelling.  No signs of head trauma. Eyes: No conjunctival pallor.  No scleral  icterus. Ears: Not hard of hearing Nose: No congestion or discharge Mouth: Tongue midline.  Mucosa moist, pink, clear.  Full dentures in place not removed for exam. Neck: No JVD, no masses, no thyromegaly Lungs: Reduced breath sounds but clear.  Marked dyspnea occurred with her trying to change position from laying on the bed to sitting up in the bed.  Raspy vocal quality. Heart: RRR.  No MRG.  S1, S2 present Abdomen: Soft.  Not distended.  Obese.  Active bowel sounds.  Fuhs, mild, nonfocal tenderness throughout the abdomen.  No HSM, masses, bruits, hernias. Rectal: Deferred. Musc/Skeltl: No joint redness, swelling. Extremities: No CCE Neurologic: Alert.  Oriented x3.  Moves all 4 limbs without tremor, strength not tested. Skin: No telangiectasia, no rash, no sores, no suspicious lesions.  No tattoos. Nodes: No cervical adenopathy Psych: Calm, cooperative, pleasant.  Intake/Output from previous day: 04/04 0701 - 04/05 0700 In: 415 [Blood:315; IV Piggyback:100] Out: -  Intake/Output this shift: No intake/output data recorded.  LAB RESULTS: Recent Labs    02/09/21 1315 02/10/21 0624  WBC 7.0 6.3  HGB 7.6* 9.6*  HCT 27.3* 32.4*  PLT 339 294   BMET Lab Results  Component Value Date   NA 138 02/10/2021   NA 133 (L) 02/09/2021   NA 137 11/03/2019   K 3.9 02/10/2021   K 4.0 02/09/2021   K 3.9 11/03/2019   CL 105 02/10/2021   CL 103 02/09/2021   CL 103 11/03/2019   CO2 25 02/10/2021   CO2 22 02/09/2021   CO2 24 11/03/2019   GLUCOSE 116 (H) 02/10/2021   GLUCOSE 123 (H) 02/09/2021   GLUCOSE 163 (H) 11/03/2019   BUN 11 02/10/2021   BUN 14 02/09/2021   BUN 10 11/03/2019   CREATININE 1.38 (H) 02/10/2021   CREATININE 1.31 (H) 02/09/2021   CREATININE 1.16 (H) 11/03/2019   CALCIUM 9.1 02/10/2021   CALCIUM 8.9 02/09/2021   CALCIUM 8.2 (L) 11/03/2019   LFT  Recent Labs    02/09/21 1315  PROT 7.8  ALBUMIN 3.8  AST 21  ALT 10  ALKPHOS 51  BILITOT 0.2*   PT/INR No  results found for: INR, PROTIME Hepatitis Panel No results for input(s): HEPBSAG, HCVAB, HEPAIGM, HEPBIGM in the last 72 hours. C-Diff No components found for: CDIFF Lipase     Component Value Date/Time   LIPASE 41 02/09/2021 1315    Drugs of Abuse  No results found for: LABOPIA, COCAINSCRNUR, LABBENZ, AMPHETMU, THCU, LABBARB   RADIOLOGY STUDIES: CT Angio Chest PE W and/or Wo Contrast  Result Date: 02/09/2021 CLINICAL DATA:  Pulmonary embolus suspected. Soreness of breath and sharp pain in left upper quadrant for a week. EXAM: CT ANGIOGRAPHY CHEST CT ABDOMEN AND PELVIS WITH CONTRAST TECHNIQUE: Multidetector CT imaging of the chest was performed using the standard protocol during bolus administration of intravenous contrast. Multiplanar CT image reconstructions and MIPs were obtained to evaluate the vascular anatomy. Multidetector CT imaging of the abdomen and pelvis was performed using the standard protocol during bolus administration of intravenous contrast. CONTRAST:  OMNIPAQUE IOHEXOL 350 MG/ML SOLN COMPARISON:  CT chest 11/12/2020 FINDINGS: CTA CHEST FINDINGS Cardiovascular: Satisfactory opacification of the pulmonary arteries to the segmental level. No evidence of pulmonary embolism. The main pulmonary artery measures at the upper limits of normal. Normal heart size. No significant pericardial effusion. The thoracic aorta is normal in caliber. Mild atherosclerotic plaque of the thoracic aorta. Mediastinum/Nodes: Redemonstration of a precardiac/upper abdominal fluid density lesion measuring approximately 5 x 3.5 x 5.5 cm (11:11, 15:69) likely representing a pericardial cyst. No enlarged mediastinal, hilar, or axillary lymph nodes. Thyroid gland, trachea, and esophagus demonstrate no significant findings. Lungs/Pleura: Expiratory phase of respiration. Subsegmental atelectasis. Mosaic attenuation due to low lung volumes. Nonspecific thin walled cystic lesion within the right lower lobe. No  definite pulmonary mass. No pulmonary nodule. No pleural effusion. No pneumothorax. Pleural effusion or pneumothorax. Musculoskeletal: No chest wall abnormality. Lower thoracic vertebral body hemangioma noted. No acute or significant osseous findings. Review of the MIP images confirms the above findings. CT ABDOMEN and PELVIS FINDINGS Hepatobiliary: No focal liver abnormality. No gallstones, gallbladder wall thickening, or pericholecystic fluid. No biliary dilatation. Pancreas: No focal lesion. Normal pancreatic contour. No surrounding inflammatory changes. No main pancreatic ductal dilatation. Spleen: Normal in size without focal abnormality. Adrenals/Urinary Tract: Adrenal glands are unremarkable. Pericentimeter fluid density lesions likely represent simple renal cysts. Otherwise the kidneys are normal, without renal calculi, focal lesion, or hydronephrosis. Bladder is unremarkable. Stomach/Bowel: Stomach is within normal limits. No evidence of bowel wall thickening or dilatation. The appendix not definitely identified. Scattered colonic diverticulosis. Vascular/Lymphatic: No abdominal aorta or iliac aneurysm. Mild atherosclerotic plaque of the aorta and its branches. No abdominal, pelvic, or inguinal lymphadenopathy. Reproductive: Coarsely calcified lesion within the uterus likely represents a uterine fibroid. Uterus and bilateral adnexa are unremarkable. Other: No intraperitoneal free fluid. No intraperitoneal free gas. No organized fluid collection. Musculoskeletal: Tiny fat containing umbilical hernia. No suspicious lytic or blastic osseous lesions. No acute displaced fracture. Multilevel degenerative changes of the spine. L5-S1 posterolateral fusion. Review of the MIP images confirms the above findings. IMPRESSION: 1. No pulmonary embolus. 2. 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 3. Scattered colonic diverticulosis with no acute diverticulitis. 4. Degenerative  uterine fibroid. 5. Otherwise no acute intra-abdominal or intrapelvic abnormality. Electronically Signed   By: Tish Frederickson M.D.   On: 02/09/2021 16:21   CT ABDOMEN PELVIS W CONTRAST  Result Date: 02/09/2021 CLINICAL DATA:  Pulmonary embolus suspected. Soreness of breath and sharp pain in left upper quadrant for a week. EXAM: CT ANGIOGRAPHY CHEST CT ABDOMEN AND PELVIS WITH CONTRAST TECHNIQUE: Multidetector CT imaging of the chest was performed using the standard protocol during bolus administration of intravenous contrast. Multiplanar CT image reconstructions and MIPs were obtained to evaluate the vascular anatomy. Multidetector CT imaging of the abdomen and pelvis was performed using the standard protocol during bolus administration of intravenous contrast. CONTRAST:  OMNIPAQUE IOHEXOL 350 MG/ML SOLN COMPARISON:  CT chest 11/12/2020 FINDINGS: CTA CHEST FINDINGS Cardiovascular: Satisfactory opacification of the pulmonary arteries to the segmental level. No evidence of pulmonary embolism. The main pulmonary artery measures at the upper limits of normal. Normal heart size. No significant pericardial effusion. The thoracic aorta is normal in caliber. Mild atherosclerotic plaque of the thoracic aorta. Mediastinum/Nodes: Redemonstration of a precardiac/upper abdominal fluid density lesion measuring approximately 5 x 3.5 x 5.5 cm (11:11, 15:69) likely representing a pericardial cyst. No enlarged mediastinal, hilar, or axillary lymph nodes. Thyroid gland, trachea, and esophagus demonstrate no significant findings. Lungs/Pleura: Expiratory phase of respiration. Subsegmental atelectasis. Mosaic attenuation due to low lung volumes. Nonspecific thin walled cystic lesion within the right lower lobe. No definite pulmonary mass. No pulmonary nodule. No pleural effusion. No pneumothorax. Pleural effusion or pneumothorax. Musculoskeletal: No chest wall abnormality. Lower thoracic vertebral body hemangioma noted. No acute  or significant osseous findings. Review of the MIP images confirms the above findings. CT ABDOMEN and PELVIS FINDINGS Hepatobiliary: No focal liver abnormality. No gallstones, gallbladder wall thickening, or pericholecystic fluid. No biliary dilatation. Pancreas: No focal lesion. Normal pancreatic contour. No surrounding inflammatory changes. No main pancreatic ductal dilatation. Spleen: Normal in size without focal abnormality. Adrenals/Urinary Tract: Adrenal glands are unremarkable. Pericentimeter fluid density lesions likely represent simple renal cysts. Otherwise the kidneys are normal, without renal calculi, focal lesion, or hydronephrosis. Bladder is unremarkable. Stomach/Bowel: Stomach is within normal limits. No evidence of bowel wall thickening or dilatation. The appendix not definitely identified. Scattered colonic diverticulosis. Vascular/Lymphatic: No abdominal aorta or iliac aneurysm. Mild atherosclerotic plaque of the aorta and its branches. No abdominal, pelvic, or inguinal lymphadenopathy. Reproductive: Coarsely calcified lesion within the uterus likely represents a uterine fibroid. Uterus and bilateral adnexa are unremarkable. Other: No intraperitoneal free fluid. No intraperitoneal free gas. No organized fluid collection. Musculoskeletal: Tiny fat containing umbilical hernia. No suspicious lytic or blastic osseous lesions. No acute displaced fracture. Multilevel degenerative changes of the spine. L5-S1 posterolateral fusion. Review of the MIP images confirms the above findings. IMPRESSION: 1. No pulmonary embolus. 2. 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 3. Scattered colonic diverticulosis with no acute diverticulitis. 4. Degenerative uterine fibroid. 5. Otherwise no acute intra-abdominal or intrapelvic abnormality. Electronically Signed   By: Tish Frederickson M.D.   On: 02/09/2021 16:21   DG Chest Port 1 View  Result Date: 02/09/2021 CLINICAL DATA:   Shortness of breath EXAM: PORTABLE CHEST 1 VIEW COMPARISON:  06/25/2020 FINDINGS: Heart size upper limits of normal. No pulmonary vascular congestion. Lungs clear. IMPRESSION: Borderline cardiomegaly. Electronically Signed   By: Acquanetta Belling M.D.   On: 02/09/2021 14:07     IMPRESSION:   *    Symptomatic anemia. Started on folate B12 shots in February.  Iron infusion x 1.   Since her symptoms of fatigue, dyspnea on exertion seem out of proportion to Hgb 7.6.  Although she has not been out of bed yet  today she is significantly dyspneic with moving around on the bed despite 2 units PRBCs.  *     FOBT positive.  Stools formed, dark over the last 2 to 3 weeks  *    OSA, not on CPAP.  *     chronic Xarelto for provoked PE in setting of COVID-19 10/2019.  Last dose was 02/08/2021 at 7 PM  *   Stage 3b CKD.  May be contributing to anemia.      PLAN:     *    EGD/colonoscopy/VCE ?timing given Xarelto.   Will discuss case with Dr. Myrtie Neither.  *    Consider obtaining echocardiogram I do not see one in the care everywhere record or epic record.  *   Agree with clear liquids.  I do not think she needs a Protonix drip so we will switch her over to scheduled Protonix  *   TSH CBC this PM   Jennye Moccasin  02/10/2021, 8:35 AM Phone [phone]  I have reviewed the entire case in detail with the above APP and discussed the plan in detail.  Therefore, I agree with the diagnoses recorded above. In addition,  I have personally interviewed and examined the patient and have personally reviewed any abdominal/pelvic CT scan images.  My additional thoughts are as follows:  Acute on chronic iron deficiency anemia, stool reportedly heme positive.  There is also likely an element of chronic kidney disease.  She is had previous endoscopic work-up with the [facility] medical clinic.  She has chronic abdominal pain with negative work-up to date, and on my exam she has multiple areas of abdominal wall tenderness to  light palpation in the upper abdominal wall and left lower lateral chest wall that are all somewhat distractible. She also appears dyspneic at rest with increased respiratory rate and at times pursed lips, but has an oxygen saturation 98% on room air, and when auscultating heart and lungs, her respirations normalize and she appears comfortable. Review of a pulmonary consult note a week for some January of this year seems to indicate that they think she may have some obstructive lung disease and is "at risk for pulmonary hypertension" because of her obesity.   She had some scant light brown stool in the rectal vault when I did a rectal exam today.  Overall, it is difficult to determine to what extent occult GI bleeding is contributing  acute on chronic iron deficiency anemia.  She needs an inpatient EGD and colonoscopy.  If negative, she needs an outpatient video capsule study and can be returned to the care of [facility] medical for that. She certainly needs ongoing outpatient hematology follow-up with IV iron and B12 treatments that she has been receiving.   I spoke with her about my impression and plans for EGD and colonoscopy tomorrow if the schedule allows.  I had her daughter on the phone during that and all questions were answered.  Risk and benefits were reviewed.  The benefits and risks of the planned procedure were described in detail with the patient or (when appropriate) their health care proxy.  Risks were outlined as including, but not limited to, bleeding, infection, perforation, adverse medication reaction leading to cardiac or pulmonary decompensation, pancreatitis (if ERCP).  The limitation of incomplete mucosal visualization was also discussed.  No guarantees or warranties were given.  Patient at increased risk for cardiopulmonary complications of procedure due to medical comorbidities.  Decision regarding resuming OAC to follow based on  EGD/colon results.  Echocardiogram was  performed today, apparently for dyspnea and the reported stable pericardial cyst.  We will follow up on results which are still pending.  Charlie PitterHenry L Danis III Office:[phone]

## 2021-02-10 NOTE — H&P (View-Only) (Signed)
                                                                           [facility] Consult: 8:35 AM 02/10/2021  LOS: 1 day    Referring Provider: Dr Pahwani  Primary Care Physician:  Arledge, Leslie, FNP Primary Gastroenterologist:  Unassigned.       Reason for Consultation:  IDA.     HPI: Autumn Barton is a 75 y.o. female.  Past medical history PE 10/2019 in setting of COVID-19.  Chronic Xarelto.  IDA (Hgb 8.1, MCV 64 06/25/2020) followed at [facility] where she recieved iron infusion x 1, monthly B12 injections starting Feb 2022.  Stage 3b CKD.  DM 2.  Warthin's tumor of parotid gland.  Morbid obesity.  Sleep apnea, not on CPAP.   Colonoscopy and EGD in 2020 at [facility].  Patient has no recall of polyps, AVMs, ulcers.  She recalls that they did not see a source for anemia.  VCE contemplated but never pursued. Patient was supposed to have knee replacement in December but anemia prevented surgery and she was referred back to GI.  Started on B12 shots monthly in February.  She had been having fairly profound fatigue and dyspnea just moving around her house or getting up from out of a chair.  This seemed to improve a little bit after her first B12 shot but symptoms recurred.  This past Sunday her daughter gave her her B12 shot and she noticed no improvement in her fatigue/weakness/dyspnea.  Also having TTP in her abdomen but no pain at rest.  Great appetite.  No nausea vomiting.  No pyrosis.  No dysphagia.  Stools have been dark for 2 to 3 weeks but no obvious blood.  She had never received blood transfusions.  Denies excessive, unusual bleeding or bruising.  Patient was taking 2 Aleve daily but advised to stop this last week so it has been a week since she had it.  Hgb 7.6 >> 2 PRBC >> 9.6.   Troponins, BNP WNL. FOBT +.   CTAP/chest: No PE.  Stable pericardial cyst.   Nonspecific cystic lesion in RLL lung.  Scattered colon tics, no diverticulitis.  Degenerative uterine fibroid.  Multilevel degenerative spine disease. Protonix drip initiated.  Omeprazole 20 mg/daily at home.  Also takes a multivitamin once a week, this may contain iron.  As far she knows family history negative for sickle cell disease or trait, thalassemia, anemias, ulcer disease, bleeding disorders. She lives in [place].  Does not and never has consumed alcohol.   Past Medical History:  Diagnosis Date  . Asthma   . Diabetes mellitus     Past Surgical History:  Procedure Laterality Date  . BACK SURGERY  2020  . COSMETIC SURGERY    . TOTAL KNEE ARTHROPLASTY      Prior to Admission medications   Medication Sig Start Date End Date Taking? Authorizing Provider  acetaminophen (TYLENOL) 500 MG tablet Take 1,000 mg by mouth every 6 (six) hours as needed for headache (pain).   Yes [provider]  albuterol (PROAIR HFA) 108 (90 Base) MCG/ACT inhaler Inhale 2 puffs into the lungs every 4 (four) hours as needed for   wheezing or shortness of breath. 09/01/20  Yes Bobbitt, Ralph Carter, MD  amLODipine (NORVASC) 5 MG tablet Take 5 mg by mouth every morning. 07/06/19  Yes [provider]  cyanocobalamin (,VITAMIN B-12,) 1000 MCG/ML injection Inject 1,000 mcg into the skin every 30 (thirty) days. 12/12/20  Yes [provider]  diclofenac Sodium (VOLTAREN) 1 % GEL Apply 2 g topically 4 (four) times daily as needed (pain). 05/23/20  Yes [provider]  fluticasone (FLONASE) 50 MCG/ACT nasal spray Place 2 sprays into both nostrils daily as needed for allergies or rhinitis. 12/16/20  Yes [provider]  hydrOXYzine (ATARAX/VISTARIL) 10 MG tablet Take 10 mg by mouth 3 (three) times daily as needed for itching. 08/23/20  Yes [provider]  lidocaine (LIDODERM) 5 % Place 1 patch onto the skin daily as needed (pain). 01/26/21  Yes [provider]   montelukast (SINGULAIR) 10 MG tablet Take 10 mg by mouth every morning. 08/04/20  Yes [provider]  omeprazole (PRILOSEC) 20 MG capsule Take 20 mg by mouth every morning. 11/26/20  Yes [provider]  rivaroxaban (XARELTO) 20 MG TABS tablet Take 20 mg by mouth every evening.   Yes [provider]  sitaGLIPtin (JANUVIA) 100 MG tablet Take 100 mg by mouth every morning. 07/21/20  Yes [provider]  SYMBICORT 160-4.5 MCG/ACT inhaler INHALE 2 PUFFS INTO THE LUNGS IN THE MORNING AND AT BEDTIME. USE WITH SPACER. RINSE, GARGLE AND SPIT OUT AFTER USE. Patient taking differently: Inhale 2 puffs into the lungs 2 (two) times daily. Use with spacer. Rinse, gargle and spit out after use. 11/20/20  Yes Bobbitt, Ralph Carter, MD  Vitamin D, Ergocalciferol, (DRISDOL) 1.25 MG (50000 UT) CAPS capsule Take 50,000 Units by mouth every Sunday. 09/23/16  Yes [provider]  zolpidem (AMBIEN) 5 MG tablet Take 5 mg by mouth at bedtime as needed for sleep. 08/23/20  Yes [provider]    Scheduled Meds: . amLODipine  5 mg Oral q morning  . cyanocobalamin  1,000 mcg Subcutaneous Q30 days  . insulin aspart  0-9 Units Subcutaneous Q4H  . mometasone-formoterol  2 puff Inhalation BID  . montelukast  10 mg Oral q morning  . [START ON 02/13/2021] pantoprazole  40 mg Intravenous Q12H  . [START ON 02/15/2021] Vitamin D (Ergocalciferol)  50,000 Units Oral Q Sun   Infusions: . sodium chloride 10 mL/hr at 02/09/21 1730  . pantoprozole (PROTONIX) infusion 8 mg/hr (02/09/21 1802)   PRN Meds: sodium chloride, acetaminophen **OR** acetaminophen, albuterol, fluticasone, hydrOXYzine, lidocaine, morphine injection, ondansetron **OR** ondansetron (ZOFRAN) IV, zolpidem   Allergies as of 02/09/2021 - Review Complete 02/09/2021  Allergen Reaction Noted  . Adhesive [tape] Other (See Comments) 11/16/2018  . Aspirin Nausea Only 01/14/2019  . Ibuprofen Nausea Only 01/14/2019     Family History  Problem Relation Age of Onset  . Eczema Son     Social History   Socioeconomic History  . Marital status: Widowed    Spouse name: Not on file  . Number of children: Not on file  . Years of education: Not on file  . Highest education level: Not on file  Occupational History  . Not on file  Tobacco Use  . Smoking status: Never Smoker  . Smokeless tobacco: Never Used  Vaping Use  . Vaping Use: Never used  Substance and Sexual Activity  . Alcohol use: No  . Drug use: No  . Sexual activity: Not on file  Other Topics   Concern  . Not on file  Social History Narrative  . Not on file   Social Determinants of Health   Financial Resource Strain: Not on file  Food Insecurity: Not on file  Transportation Needs: Not on file  Physical Activity: Not on file  Stress: Not on file  Social Connections: Not on file  Intimate Partner Violence: Not on file    REVIEW OF SYSTEMS: Constitutional: Weakness, fatigue as per HPI ENT:  No nose bleeds Pulm: Shortness of breath, dry cough. CV:  No palpitations, no LE edema.  No angina GU:  No hematuria, no frequency GI: See HPI. Heme: See HPI. Transfusions: See HPI. Neuro:  No headaches, no peripheral tingling or numbness.  No seizures, no syncope. Derm:  No itching, no rash or sores.  Endocrine:  No sweats or chills.  No polyuria or dysuria Immunization: Vaccinated in March 2021 with Pfizer COVID-19 VAX. Travel:  None beyond local counties in last few months.    PHYSICAL EXAM: Vital signs in last 24 hours: Vitals:   02/10/21 0300 02/10/21 0812  BP: (!) 141/93   Pulse: 85   Resp: 16   Temp: 98.7 F (37.1 C)   SpO2: 100% 99%   Wt Readings from Last 3 Encounters:  02/09/21 100.1 kg  05/08/20 100.6 kg  11/07/19 108.4 kg    General: Obese, elderly, somewhat chronically ill-appearing.  Resting in bed. Head: No facial asymmetry or swelling.  No signs of head trauma. Eyes: No conjunctival pallor.  No scleral  icterus. Ears: Not hard of hearing Nose: No congestion or discharge Mouth: Tongue midline.  Mucosa moist, pink, clear.  Full dentures in place not removed for exam. Neck: No JVD, no masses, no thyromegaly Lungs: Reduced breath sounds but clear.  Marked dyspnea occurred with her trying to change position from laying on the bed to sitting up in the bed.  Raspy vocal quality. Heart: RRR.  No MRG.  S1, S2 present Abdomen: Soft.  Not distended.  Obese.  Active bowel sounds.  Fuhs, mild, nonfocal tenderness throughout the abdomen.  No HSM, masses, bruits, hernias. Rectal: Deferred. Musc/Skeltl: No joint redness, swelling. Extremities: No CCE Neurologic: Alert.  Oriented x3.  Moves all 4 limbs without tremor, strength not tested. Skin: No telangiectasia, no rash, no sores, no suspicious lesions.  No tattoos. Nodes: No cervical adenopathy Psych: Calm, cooperative, pleasant.  Intake/Output from previous day: 04/04 0701 - 04/05 0700 In: 415 [Blood:315; IV Piggyback:100] Out: -  Intake/Output this shift: No intake/output data recorded.  LAB RESULTS: Recent Labs    02/09/21 1315 02/10/21 0624  WBC 7.0 6.3  HGB 7.6* 9.6*  HCT 27.3* 32.4*  PLT 339 294   BMET Lab Results  Component Value Date   NA 138 02/10/2021   NA 133 (L) 02/09/2021   NA 137 11/03/2019   K 3.9 02/10/2021   K 4.0 02/09/2021   K 3.9 11/03/2019   CL 105 02/10/2021   CL 103 02/09/2021   CL 103 11/03/2019   CO2 25 02/10/2021   CO2 22 02/09/2021   CO2 24 11/03/2019   GLUCOSE 116 (H) 02/10/2021   GLUCOSE 123 (H) 02/09/2021   GLUCOSE 163 (H) 11/03/2019   BUN 11 02/10/2021   BUN 14 02/09/2021   BUN 10 11/03/2019   CREATININE 1.38 (H) 02/10/2021   CREATININE 1.31 (H) 02/09/2021   CREATININE 1.16 (H) 11/03/2019   CALCIUM 9.1 02/10/2021   CALCIUM 8.9 02/09/2021   CALCIUM 8.2 (L) 11/03/2019   LFT   Recent Labs    02/09/21 1315  PROT 7.8  ALBUMIN 3.8  AST 21  ALT 10  ALKPHOS 51  BILITOT 0.2*   PT/INR No  results found for: INR, PROTIME Hepatitis Panel No results for input(s): HEPBSAG, HCVAB, HEPAIGM, HEPBIGM in the last 72 hours. C-Diff No components found for: CDIFF Lipase     Component Value Date/Time   LIPASE 41 02/09/2021 1315    Drugs of Abuse  No results found for: LABOPIA, COCAINSCRNUR, LABBENZ, AMPHETMU, THCU, LABBARB   RADIOLOGY STUDIES: CT Angio Chest PE W and/or Wo Contrast  Result Date: 02/09/2021 CLINICAL DATA:  Pulmonary embolus suspected. Soreness of breath and sharp pain in left upper quadrant for a week. EXAM: CT ANGIOGRAPHY CHEST CT ABDOMEN AND PELVIS WITH CONTRAST TECHNIQUE: Multidetector CT imaging of the chest was performed using the standard protocol during bolus administration of intravenous contrast. Multiplanar CT image reconstructions and MIPs were obtained to evaluate the vascular anatomy. Multidetector CT imaging of the abdomen and pelvis was performed using the standard protocol during bolus administration of intravenous contrast. CONTRAST:  100mL OMNIPAQUE IOHEXOL 350 MG/ML SOLN COMPARISON:  CT chest 11/12/2020 FINDINGS: CTA CHEST FINDINGS Cardiovascular: Satisfactory opacification of the pulmonary arteries to the segmental level. No evidence of pulmonary embolism. The main pulmonary artery measures at the upper limits of normal. Normal heart size. No significant pericardial effusion. The thoracic aorta is normal in caliber. Mild atherosclerotic plaque of the thoracic aorta. Mediastinum/Nodes: Redemonstration of a precardiac/upper abdominal fluid density lesion measuring approximately 5 x 3.5 x 5.5 cm (11:11, 15:69) likely representing a pericardial cyst. No enlarged mediastinal, hilar, or axillary lymph nodes. Thyroid gland, trachea, and esophagus demonstrate no significant findings. Lungs/Pleura: Expiratory phase of respiration. Subsegmental atelectasis. Mosaic attenuation due to low lung volumes. Nonspecific thin walled cystic lesion within the right lower lobe. No  definite pulmonary mass. No pulmonary nodule. No pleural effusion. No pneumothorax. Pleural effusion or pneumothorax. Musculoskeletal: No chest wall abnormality. Lower thoracic vertebral body hemangioma noted. No acute or significant osseous findings. Review of the MIP images confirms the above findings. CT ABDOMEN and PELVIS FINDINGS Hepatobiliary: No focal liver abnormality. No gallstones, gallbladder wall thickening, or pericholecystic fluid. No biliary dilatation. Pancreas: No focal lesion. Normal pancreatic contour. No surrounding inflammatory changes. No main pancreatic ductal dilatation. Spleen: Normal in size without focal abnormality. Adrenals/Urinary Tract: Adrenal glands are unremarkable. Pericentimeter fluid density lesions likely represent simple renal cysts. Otherwise the kidneys are normal, without renal calculi, focal lesion, or hydronephrosis. Bladder is unremarkable. Stomach/Bowel: Stomach is within normal limits. No evidence of bowel wall thickening or dilatation. The appendix not definitely identified. Scattered colonic diverticulosis. Vascular/Lymphatic: No abdominal aorta or iliac aneurysm. Mild atherosclerotic plaque of the aorta and its branches. No abdominal, pelvic, or inguinal lymphadenopathy. Reproductive: Coarsely calcified lesion within the uterus likely represents a uterine fibroid. Uterus and bilateral adnexa are unremarkable. Other: No intraperitoneal free fluid. No intraperitoneal free gas. No organized fluid collection. Musculoskeletal: Tiny fat containing umbilical hernia. No suspicious lytic or blastic osseous lesions. No acute displaced fracture. Multilevel degenerative changes of the spine. L5-S1 posterolateral fusion. Review of the MIP images confirms the above findings. IMPRESSION: 1. No pulmonary embolus. 2. 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 3. Scattered colonic diverticulosis with no acute diverticulitis. 4. Degenerative  uterine fibroid. 5. Otherwise no acute intra-abdominal or intrapelvic abnormality. Electronically Signed   By: Morgane  Naveau M.D.   On: 02/09/2021 16:21   CT ABDOMEN PELVIS W CONTRAST    Result Date: 02/09/2021 CLINICAL DATA:  Pulmonary embolus suspected. Soreness of breath and sharp pain in left upper quadrant for a week. EXAM: CT ANGIOGRAPHY CHEST CT ABDOMEN AND PELVIS WITH CONTRAST TECHNIQUE: Multidetector CT imaging of the chest was performed using the standard protocol during bolus administration of intravenous contrast. Multiplanar CT image reconstructions and MIPs were obtained to evaluate the vascular anatomy. Multidetector CT imaging of the abdomen and pelvis was performed using the standard protocol during bolus administration of intravenous contrast. CONTRAST:  100mL OMNIPAQUE IOHEXOL 350 MG/ML SOLN COMPARISON:  CT chest 11/12/2020 FINDINGS: CTA CHEST FINDINGS Cardiovascular: Satisfactory opacification of the pulmonary arteries to the segmental level. No evidence of pulmonary embolism. The main pulmonary artery measures at the upper limits of normal. Normal heart size. No significant pericardial effusion. The thoracic aorta is normal in caliber. Mild atherosclerotic plaque of the thoracic aorta. Mediastinum/Nodes: Redemonstration of a precardiac/upper abdominal fluid density lesion measuring approximately 5 x 3.5 x 5.5 cm (11:11, 15:69) likely representing a pericardial cyst. No enlarged mediastinal, hilar, or axillary lymph nodes. Thyroid gland, trachea, and esophagus demonstrate no significant findings. Lungs/Pleura: Expiratory phase of respiration. Subsegmental atelectasis. Mosaic attenuation due to low lung volumes. Nonspecific thin walled cystic lesion within the right lower lobe. No definite pulmonary mass. No pulmonary nodule. No pleural effusion. No pneumothorax. Pleural effusion or pneumothorax. Musculoskeletal: No chest wall abnormality. Lower thoracic vertebral body hemangioma noted. No acute  or significant osseous findings. Review of the MIP images confirms the above findings. CT ABDOMEN and PELVIS FINDINGS Hepatobiliary: No focal liver abnormality. No gallstones, gallbladder wall thickening, or pericholecystic fluid. No biliary dilatation. Pancreas: No focal lesion. Normal pancreatic contour. No surrounding inflammatory changes. No main pancreatic ductal dilatation. Spleen: Normal in size without focal abnormality. Adrenals/Urinary Tract: Adrenal glands are unremarkable. Pericentimeter fluid density lesions likely represent simple renal cysts. Otherwise the kidneys are normal, without renal calculi, focal lesion, or hydronephrosis. Bladder is unremarkable. Stomach/Bowel: Stomach is within normal limits. No evidence of bowel wall thickening or dilatation. The appendix not definitely identified. Scattered colonic diverticulosis. Vascular/Lymphatic: No abdominal aorta or iliac aneurysm. Mild atherosclerotic plaque of the aorta and its branches. No abdominal, pelvic, or inguinal lymphadenopathy. Reproductive: Coarsely calcified lesion within the uterus likely represents a uterine fibroid. Uterus and bilateral adnexa are unremarkable. Other: No intraperitoneal free fluid. No intraperitoneal free gas. No organized fluid collection. Musculoskeletal: Tiny fat containing umbilical hernia. No suspicious lytic or blastic osseous lesions. No acute displaced fracture. Multilevel degenerative changes of the spine. L5-S1 posterolateral fusion. Review of the MIP images confirms the above findings. IMPRESSION: 1. No pulmonary embolus. 2. 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 3. Scattered colonic diverticulosis with no acute diverticulitis. 4. Degenerative uterine fibroid. 5. Otherwise no acute intra-abdominal or intrapelvic abnormality. Electronically Signed   By: Morgane  Naveau M.D.   On: 02/09/2021 16:21   DG Chest Port 1 View  Result Date: 02/09/2021 CLINICAL DATA:   Shortness of breath EXAM: PORTABLE CHEST 1 VIEW COMPARISON:  06/25/2020 FINDINGS: Heart size upper limits of normal. No pulmonary vascular congestion. Lungs clear. IMPRESSION: Borderline cardiomegaly. Electronically Signed   By: Farhaan  Mir M.D.   On: 02/09/2021 14:07     IMPRESSION:   *    Symptomatic anemia. Started on folate B12 shots in February.  Iron infusion x 1.   Since her symptoms of fatigue, dyspnea on exertion seem out of proportion to Hgb 7.6.  Although she has not been out of bed yet   today she is significantly dyspneic with moving around on the bed despite 2 units PRBCs.  *     FOBT positive.  Stools formed, dark over the last 2 to 3 weeks  *    OSA, not on CPAP.  *     chronic Xarelto for provoked PE in setting of COVID-19 10/2019.  Last dose was 02/08/2021 at 7 PM  *   Stage 3b CKD.  May be contributing to anemia.      PLAN:     *    EGD/colonoscopy/VCE ?timing given Xarelto.   Will discuss case with Dr. Danis.  *    Consider obtaining echocardiogram I do not see one in the care everywhere record or epic record.  *   Agree with clear liquids.  I do not think she needs a Protonix drip so we will switch her over to scheduled Protonix  *   TSH CBC this PM   Autumn Barton  02/10/2021, 8:35 AM Phone [phone]  I have reviewed the entire case in detail with the above APP and discussed the plan in detail.  Therefore, I agree with the diagnoses recorded above. In addition,  I have personally interviewed and examined the patient and have personally reviewed any abdominal/pelvic CT scan images.  My additional thoughts are as follows:  Acute on chronic iron deficiency anemia, stool reportedly heme positive.  There is also likely an element of chronic kidney disease.  She is had previous endoscopic work-up with the [facility].  She has chronic abdominal pain with negative work-up to date, and on my exam she has multiple areas of abdominal wall tenderness to  light palpation in the upper abdominal wall and left lower lateral chest wall that are all somewhat distractible. She also appears dyspneic at rest with increased respiratory rate and at times pursed lips, but has an oxygen saturation 98% on room air, and when auscultating heart and lungs, her respirations normalize and she appears comfortable. Review of a pulmonary consult note a week for some January of this year seems to indicate that they think she may have some obstructive lung disease and is "at risk for pulmonary hypertension" because of her obesity.   She had some scant light brown stool in the rectal vault when I did a rectal exam today.  Overall, it is difficult to determine to what extent occult GI bleeding is contributing  acute on chronic iron deficiency anemia.  She needs an inpatient EGD and colonoscopy.  If negative, she needs an outpatient video capsule study and can be returned to the care of [facility] for that. She certainly needs ongoing outpatient hematology follow-up with IV iron and B12 treatments that she has been receiving.   I spoke with her about my impression and plans for EGD and colonoscopy tomorrow if the schedule allows.  I had her daughter on the phone during that and all questions were answered.  Risk and benefits were reviewed.  The benefits and risks of the planned procedure were described in detail with the patient or (when appropriate) their health care proxy.  Risks were outlined as including, but not limited to, bleeding, infection, perforation, adverse medication reaction leading to cardiac or pulmonary decompensation, pancreatitis (if ERCP).  The limitation of incomplete mucosal visualization was also discussed.  No guarantees or warranties were given.  Patient at increased risk for cardiopulmonary complications of procedure due to medical comorbidities.  Decision regarding resuming OAC to follow based on   EGD/colon results.  Echocardiogram was  performed today, apparently for dyspnea and the reported stable pericardial cyst.  We will follow up on results which are still pending.  Autumn Barton Office:[phone]      

## 2021-02-10 NOTE — Progress Notes (Signed)
Consent form obtained and placed in pt's chart.  ° °Edge Mauger S Antonya Leeder, RN ° °

## 2021-02-10 NOTE — Progress Notes (Signed)
  Echocardiogram 2D Echocardiogram has been performed.  Autumn Barton 02/10/2021, 12:37 PM

## 2021-02-11 ENCOUNTER — Encounter: Payer: Self-pay | Admitting: Gastroenterology

## 2021-02-11 ENCOUNTER — Encounter (HOSPITAL_COMMUNITY)
Admission: EM | Disposition: A | Discharge status: Home / Self Care | Payer: Self-pay | Source: Home / Self Care | Attending: Family Medicine

## 2021-02-11 ENCOUNTER — Encounter (HOSPITAL_COMMUNITY): Payer: Self-pay | Admitting: Internal Medicine

## 2021-02-11 ENCOUNTER — Inpatient Hospital Stay (HOSPITAL_COMMUNITY): Payer: Medicare HMO | Admitting: Anesthesiology

## 2021-02-11 DIAGNOSIS — D62 Acute posthemorrhagic anemia: Secondary | ICD-10-CM

## 2021-02-11 DIAGNOSIS — K922 Gastrointestinal hemorrhage, unspecified: Secondary | ICD-10-CM

## 2021-02-11 HISTORY — PX: ESOPHAGOGASTRODUODENOSCOPY (EGD) WITH PROPOFOL: SHX5813

## 2021-02-11 HISTORY — PX: GIVENS CAPSULE STUDY: SHX5432

## 2021-02-11 HISTORY — PX: COLONOSCOPY WITH PROPOFOL: SHX5780

## 2021-02-11 HISTORY — PX: BIOPSY: SHX5522

## 2021-02-11 LAB — TYPE AND SCREEN
ABO/RH(D): B POS
Antibody Screen: NEGATIVE
Unit division: 0
Unit division: 0

## 2021-02-11 LAB — CBC
HCT: 30.4 % — ABNORMAL LOW (ref 36.0–46.0)
Hemoglobin: 8.9 g/dL — ABNORMAL LOW (ref 12.0–15.0)
MCH: 21.3 pg — ABNORMAL LOW (ref 26.0–34.0)
MCHC: 29.3 g/dL — ABNORMAL LOW (ref 30.0–36.0)
MCV: 72.9 fL — ABNORMAL LOW (ref 80.0–100.0)
Platelets: 295 10*3/uL (ref 150–400)
RBC: 4.17 MIL/uL (ref 3.87–5.11)
RDW: 20.1 % — ABNORMAL HIGH (ref 11.5–15.5)
WBC: 6.7 10*3/uL (ref 4.0–10.5)
nRBC: 0.3 % — ABNORMAL HIGH (ref 0.0–0.2)

## 2021-02-11 LAB — GLUCOSE, CAPILLARY
Glucose-Capillary: 124 mg/dL — ABNORMAL HIGH (ref 70–99)
Glucose-Capillary: 124 mg/dL — ABNORMAL HIGH (ref 70–99)
Glucose-Capillary: 128 mg/dL — ABNORMAL HIGH (ref 70–99)
Glucose-Capillary: 131 mg/dL — ABNORMAL HIGH (ref 70–99)
Glucose-Capillary: 139 mg/dL — ABNORMAL HIGH (ref 70–99)
Glucose-Capillary: 147 mg/dL — ABNORMAL HIGH (ref 70–99)
Glucose-Capillary: 170 mg/dL — ABNORMAL HIGH (ref 70–99)

## 2021-02-11 LAB — BPAM RBC
Blood Product Expiration Date: 202204242359
Blood Product Expiration Date: 202204272359
ISSUE DATE / TIME: 202204042255
ISSUE DATE / TIME: 202204050145
Unit Type and Rh: 7300
Unit Type and Rh: 7300

## 2021-02-11 LAB — BASIC METABOLIC PANEL
Anion gap: 7 (ref 5–15)
BUN: 11 mg/dL (ref 8–23)
CO2: 23 mmol/L (ref 22–32)
Calcium: 9.1 mg/dL (ref 8.9–10.3)
Chloride: 108 mmol/L (ref 98–111)
Creatinine, Ser: 1.53 mg/dL — ABNORMAL HIGH (ref 0.44–1.00)
GFR, Estimated: 35 mL/min — ABNORMAL LOW (ref >60)
Glucose, Bld: 139 mg/dL — ABNORMAL HIGH (ref 70–99)
Potassium: 3.6 mmol/L (ref 3.5–5.1)
Sodium: 138 mmol/L (ref 135–145)

## 2021-02-11 SURGERY — COLONOSCOPY WITH PROPOFOL
Anesthesia: Monitor Anesthesia Care

## 2021-02-11 SURGERY — IMAGING PROCEDURE, GI TRACT, INTRALUMINAL, VIA CAPSULE
Anesthesia: Monitor Anesthesia Care

## 2021-02-11 MED ORDER — PROPOFOL 500 MG/50ML IV EMUL
INTRAVENOUS | Status: DC | PRN
  Administered 2021-02-11: 100 ug/kg/min via INTRAVENOUS

## 2021-02-11 MED ORDER — LACTATED RINGERS IV SOLN: INTRAVENOUS | Status: DC | PRN

## 2021-02-11 MED ORDER — PROPOFOL 10 MG/ML IV BOLUS
INTRAVENOUS | Status: DC | PRN
  Administered 2021-02-11: 15 mg via INTRAVENOUS

## 2021-02-11 MED ORDER — LIDOCAINE HCL (CARDIAC) PF 100 MG/5ML IV SOSY
PREFILLED_SYRINGE | INTRAVENOUS | Status: DC | PRN
  Administered 2021-02-11: 50 mg via INTRAVENOUS

## 2021-02-11 SURGICAL SUPPLY — 25 items

## 2021-02-11 NOTE — Anesthesia Procedure Notes (Signed)
Procedure Name: MAC Date/Time: 02/11/2021 10:32 AM Performed by: Mariea Clonts, CRNA Pre-anesthesia Checklist: Patient identified, Emergency Drugs available, Suction available, Patient being monitored and Timeout performed Patient Re-evaluated:Patient Re-evaluated prior to induction Oxygen Delivery Method: Nasal cannula

## 2021-02-11 NOTE — Interval H&P Note (Signed)
History and Physical Interval Note:  02/11/2021 10:32 AM  Autumn Barton  has presented today for surgery, with the diagnosis of FOBT positive, microcytic anemia.  The various methods of treatment have been discussed with the patient and family. After consideration of risks, benefits and other options for treatment, the patient has consented to  Procedure(s): COLONOSCOPY WITH PROPOFOL (N/A) ESOPHAGOGASTRODUODENOSCOPY (EGD) WITH PROPOFOL (N/A) as a surgical intervention.  The patient's history has been reviewed, patient examined, no change in status, stable for surgery.  I have reviewed the patient's chart and labs.  Questions were answered to the patient's satisfaction.     Tressia Danas

## 2021-02-11 NOTE — Plan of Care (Signed)

## 2021-02-11 NOTE — Op Note (Signed)
[facility] Patient Name: Autumn Barton Procedure Date : 02/11/2021 MRN: [id number] Attending MD: Tressia Danas MD, MD Date of Birth: 04-07-1946 CSN: [id number] Age: 75 Admit Type: Inpatient Procedure:                Upper GI endoscopy Indications:              Acute post hemorrhagic anemia, Heme positive stool Providers:                Tressia Danas MD, MD, Dayton Bailiff, RN,                            Fransisca Connors, Leanne Lovely, Technician,                            Albertina Senegal. Beckner, CRNA Referring MD:              Medicines:                Monitored Anesthesia Care Complications:            No immediate complications. Estimated blood loss:                            Minimal. Estimated Blood Loss:     Estimated blood loss was minimal. Procedure:                Pre-Anesthesia Assessment:                           - Prior to the procedure, a History and Physical                            was performed, and patient medications and                            allergies were reviewed. The patient's tolerance of                            previous anesthesia was also reviewed. The risks                            and benefits of the procedure and the sedation                            options and risks were discussed with the patient.                            All questions were answered, and informed consent                            was obtained. Prior Anticoagulants: The patient has                            taken Xarelto (rivaroxaban), last dose was 4 days  prior to procedure. ASA Grade Assessment: III - A                            patient with severe systemic disease. After                            reviewing the risks and benefits, the patient was                            deemed in satisfactory condition to undergo the                            procedure.                           After obtaining informed consent,  the endoscope was                            passed under direct vision. Throughout the                            procedure, the patient's blood pressure, pulse, and                            oxygen saturations were monitored continuously. The                            GIF-H190 ([id number] Olympus gastroscope was                            introduced through the mouth, and advanced to the                            third part of duodenum. The upper GI endoscopy was                            accomplished without difficulty. The patient                            tolerated the procedure well. Scope In: Scope Out: Findings:      The examined esophagus was normal.      A 2 cm hiatal hernia was present.      The examined duodenum was normal. Biopsies were taken with a cold       forceps for histology. Estimated blood loss was minimal.      The cardia and gastric fundus were normal on retroflexion.      The exam was otherwise without abnormality. Impression:               - No source of anemia identified on this                            examination. Recommendation:           - Patient has a contact number available for  emergencies. The signs and symptoms of potential                            delayed complications were discussed with the                            patient. Return to normal activities tomorrow.                            Written discharge instructions were provided to the                            patient.                           - Clear liquid diet.                           - Continue present medications.                           - Await pathology results.                           - Proceed with colonoscopy today for further                            evaluation. Procedure Code(s):        --- Professional ---                           [phone], Esophagogastroduodenoscopy, flexible,                            transoral; with biopsy, single  or multiple Diagnosis Code(s):        --- Professional ---                           K44.9, Diaphragmatic hernia without obstruction or                            gangrene                           D62, Acute posthemorrhagic anemia                           R19.5, Other fecal abnormalities CPT copyright 2019 [employer]. All rights reserved. The codes documented in this report are preliminary and upon coder review may  be revised to meet current compliance requirements. Tressia Danas MD, MD 02/11/2021 11:27:24 AM This report has been signed electronically. Number of Addenda: 0

## 2021-02-11 NOTE — Progress Notes (Addendum)
PROGRESS NOTE    Autumn Barton  RUE:[id number] DOB: 04-24-1946 DOA: 02/09/2021 PCP: Milta Deiters, FNP   Brief Narrative:  Autumn Barton is a 75 y.o. female with medical history significant of DM2, asthma, DVT/PE back in Jan 2021 in setting of COVID-19 illness now on Xarelto presented to ED with c/o SOB, Abd pain / CP.  LUQ  And LL chest pain.  Symptoms onset some months ago but worsening for the past 3 days.  Worked up as outpt, noted to have iron def anemia, supposed to see GI doc at [facility] this week but came in to ED due to worsening symptoms.  SOB worse with exertion.  She has had melena for the past 3 weeks she relates. Does have somewhat chronic use of NSAIDS due to chronic back pain.  Upon arrival to ED, hemoglobin 7.6, Hemoccult positive.  Admitted to hospital service.  GI consulted.  Transfuse 2 units of PRBC.  Underwent colonoscopy and EGD on 02/11/2021 which were both unremarkable.    Assessment & Plan:   Principal Problem:   Symptomatic anemia Active Problems:   Diabetes mellitus (HCC)   Gastrointestinal hemorrhage with melena   History of DVT (deep vein thrombosis)   HTN (hypertension)  Symptomatic anemia/presumed upper GI bleed/acute blood loss anemia: Patient has history of taking NSAIDs couple of times a week for about a year for her back pain.  Has been having melena.  Came in with hemoglobin of 7.6.  Was transfused 2 unit of PRBC.  Hemoglobin improved overnight posttransfusion.  She continues to have exertional shortness of breath and chronic abdominal pain and tenderness.  Status post EGD and colonoscopy today and both of them were unremarkable.  Capsule endoscopy in progress.  Continue PPI.  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  Echo did not show any cyst.  Normal ejection fraction.  Grade 2 diastolic dysfunction.  History of DVT/PE: Was on Xarelto which is on hold due to upper GI bleed.  Type 2 diabetes mellitus: Hemoglobin A1c 6.4.   Blood sugar controlled.  Hold home hypoglycemics.  Continue SSI.  Essential hypertension: Controlled.  Continue amlodipine.  Chronic abdominal pain and tenderness: CT abdomen and pelvis with IV contrast did not show any acute pathology.  CKD stage IIIb: Stable.  Monitor.  DVT prophylaxis: SCDs Start: 02/09/21 2134   Code Status: Full Code  Family Communication:  None present at bedside.  Plan of care discussed with patient in length and he verbalized understanding and agreed with it.  Status is: Inpatient  Remains inpatient appropriate because:Ongoing diagnostic testing needed not appropriate for outpatient work up   Dispo: The patient is from: Home              Anticipated d/c is to: Home              Patient currently is not medically stable to d/c.   Difficult to place patient No        Estimated body mass index is 40.37 kg/m as calculated from the following:   Height as of this encounter:  (1.575 m).   Weight as of this encounter: 100.1 kg.      Nutritional status:               Consultants:   GI  Procedures:   None  Antimicrobials:  Anti-infectives (From admission, onward)   None         Subjective: Seen and examined this morning.  Feels  slightly better but continues to have exertional shortness of breath and chronic abdominal pain.  Objective: Vitals:   02/11/21 1117 02/11/21 1127 02/11/21 1128 02/11/21 1144  BP: 129/68 (!) 120/94 (!) 120/94 122/73  Pulse: (!) 106 95 84 84  Resp: 15 19 19 18   Temp:    98.3 F (36.8 C)  TempSrc:    Oral  SpO2: 95% 100% 100% 100%  Weight:      Height:        Intake/Output Summary (Last 24 hours) at 02/11/2021 1323 Last data filed at 02/11/2021 1107 Gross per 24 hour  Intake 1370 ml  Output --  Net 1370 ml   Filed Weights   02/09/21 1232 02/09/21 2120  Weight: 102.1 kg 100.1 kg    Examination:  General exam: Appears calm and comfortable, morbidly obese Respiratory system: Clear to  auscultation. Respiratory effort normal. Cardiovascular system: S1 & S2 heard, RRR. No JVD, murmurs, rubs, gallops or clicks. No pedal edema. Gastrointestinal system: Abdomen is nondistended, soft and tender at epigastrium, periumbilical and left upper quadrant. No organomegaly or masses felt. Normal bowel sounds heard. Central nervous system: Alert and oriented. No focal neurological deficits. Extremities: Symmetric 5 x 5 power. Skin: No rashes, lesions or ulcers.  Psychiatry: Judgement and insight appear normal. Mood & affect appropriate.   Data Reviewed: I have personally reviewed following labs and imaging studies  CBC: Recent Labs  Lab 02/09/21 1315 02/10/21 0624 02/10/21 0946 02/10/21 1825 02/11/21 0441  WBC 7.0 6.3 5.6 6.6 6.7  NEUTROABS 4.7  --   --   --   --   HGB 7.6* 9.6* 8.9* 9.0* 8.9*  HCT 27.3* 32.4* 30.7* 31.2* 30.4*  MCV 69.6* 72.2* 72.7* 72.7* 72.9*  PLT 339 294 299 312 295   Basic Metabolic Panel: Recent Labs  Lab 02/09/21 1315 02/10/21 0624 02/11/21 0137  NA 133* 138 138  K 4.0 3.9 3.6  CL 103 105 108  CO2 22 25 23   GLUCOSE 123* 116* 139*  BUN 14 11 11   CREATININE 1.31* 1.38* 1.53*  CALCIUM 8.9 9.1 9.1   GFR: Estimated Creatinine Clearance: 34.6 mL/min (A) (by C-G formula based on SCr of 1.53 mg/dL (H)). Liver Function Tests: Recent Labs  Lab 02/09/21 1315  AST 21  ALT 10  ALKPHOS 51  BILITOT 0.2*  PROT 7.8  ALBUMIN 3.8   Recent Labs  Lab 02/09/21 1315  LIPASE 41   No results for input(s): AMMONIA in the last 168 hours. Coagulation Profile: No results for input(s): INR, PROTIME in the last 168 hours. Cardiac Enzymes: No results for input(s): CKTOTAL, CKMB, CKMBINDEX, TROPONINI in the last 168 hours. BNP (last 3 results) No results for input(s): PROBNP in the last 8760 hours. HbA1C: Recent Labs    02/09/21 2140  HGBA1C 6.4*   CBG: Recent Labs  Lab 02/10/21 2123 02/11/21 0022 02/11/21 0404 02/11/21 0845 02/11/21 1204   GLUCAP 104* 147* 128* 131* 124*   Lipid Profile: No results for input(s): CHOL, HDL, LDLCALC, TRIG, CHOLHDL, LDLDIRECT in the last 72 hours. Thyroid Function Tests: Recent Labs    02/10/21 1825  TSH 2.880   Anemia Panel: No results for input(s): VITAMINB12, FOLATE, FERRITIN, TIBC, IRON, RETICCTPCT in the last 72 hours. Sepsis Labs: No results for input(s): PROCALCITON, LATICACIDVEN in the last 168 hours.  Recent Results (from the past 240 hour(s))  Resp Panel by RT-PCR (Flu A&B, Covid) Nasopharyngeal Swab     Status: None   Collection Time: 02/09/21  1:25 PM   Specimen: Nasopharyngeal Swab; Nasopharyngeal(NP) swabs in vial transport medium  Result Value Ref Range Status   SARS Coronavirus 2 by RT PCR NEGATIVE NEGATIVE Final    Comment: (NOTE) SARS-CoV-2 target nucleic acids are NOT DETECTED.  The SARS-CoV-2 RNA is generally detectable in upper respiratory specimens during the acute phase of infection. The lowest concentration of SARS-CoV-2 viral copies this assay can detect is 138 copies/mL. A negative result does not preclude SARS-Cov-2 infection and should not be used as the sole basis for treatment or other patient management decisions. A negative result may occur with  improper specimen collection/handling, submission of specimen other than nasopharyngeal swab, presence of viral mutation(s) within the areas targeted by this assay, and inadequate number of viral copies(<138 copies/mL). A negative result must be combined with clinical observations, patient history, and epidemiological information. The expected result is Negative.  Fact Sheet for Patients:  BloggerCourse.com  Fact Sheet for Healthcare Providers:  SeriousBroker.it  This test is no t yet approved or cleared by the [place] FDA and  has been authorized for detection and/or diagnosis of SARS-CoV-2 by FDA under an Emergency Use Authorization (EUA). This  EUA will remain  in effect (meaning this test can be used) for the duration of the COVID-19 declaration under Section 564(b)(1) of the Act, 21 U.S.C.section 360bbb-3(b)(1), unless the authorization is terminated  or revoked sooner.       Influenza A by PCR NEGATIVE NEGATIVE Final   Influenza B by PCR NEGATIVE NEGATIVE Final    Comment: (NOTE) The Xpert Xpress SARS-CoV-2/FLU/RSV plus assay is intended as an aid in the diagnosis of influenza from Nasopharyngeal swab specimens and should not be used as a sole basis for treatment. Nasal washings and aspirates are unacceptable for Xpert Xpress SARS-CoV-2/FLU/RSV testing.  Fact Sheet for Patients: BloggerCourse.com  Fact Sheet for Healthcare Providers: SeriousBroker.it  This test is not yet approved or cleared by the [place] FDA and has been authorized for detection and/or diagnosis of SARS-CoV-2 by FDA under an Emergency Use Authorization (EUA). This EUA will remain in effect (meaning this test can be used) for the duration of the COVID-19 declaration under Section 564(b)(1) of the Act, 21 U.S.C. section 360bbb-3(b)(1), unless the authorization is terminated or revoked.  Performed at [facility], [address] [place], [place] [address]       Radiology Studies: CT Angio Chest PE W and/or Wo Contrast  Result Date: 02/09/2021 CLINICAL DATA:  Pulmonary embolus suspected. Soreness of breath and sharp pain in left upper quadrant for a week. EXAM: CT ANGIOGRAPHY CHEST CT ABDOMEN AND PELVIS WITH CONTRAST TECHNIQUE: Multidetector CT imaging of the chest was performed using the standard protocol during bolus administration of intravenous contrast. Multiplanar CT image reconstructions and MIPs were obtained to evaluate the vascular anatomy. Multidetector CT imaging of the abdomen and pelvis was performed using the standard protocol during bolus administration of intravenous  contrast. CONTRAST:  OMNIPAQUE IOHEXOL 350 MG/ML SOLN COMPARISON:  CT chest 11/12/2020 FINDINGS: CTA CHEST FINDINGS Cardiovascular: Satisfactory opacification of the pulmonary arteries to the segmental level. No evidence of pulmonary embolism. The main pulmonary artery measures at the upper limits of normal. Normal heart size. No significant pericardial effusion. The thoracic aorta is normal in caliber. Mild atherosclerotic plaque of the thoracic aorta. Mediastinum/Nodes: Redemonstration of a precardiac/upper abdominal fluid density lesion measuring approximately 5 x 3.5 x 5.5 cm (11:11, 15:69) likely representing a pericardial cyst. No enlarged mediastinal, hilar, or  axillary lymph nodes. Thyroid gland, trachea, and esophagus demonstrate no significant findings. Lungs/Pleura: Expiratory phase of respiration. Subsegmental atelectasis. Mosaic attenuation due to low lung volumes. Nonspecific thin walled cystic lesion within the right lower lobe. No definite pulmonary mass. No pulmonary nodule. No pleural effusion. No pneumothorax. Pleural effusion or pneumothorax. Musculoskeletal: No chest wall abnormality. Lower thoracic vertebral body hemangioma noted. No acute or significant osseous findings. Review of the MIP images confirms the above findings. CT ABDOMEN and PELVIS FINDINGS Hepatobiliary: No focal liver abnormality. No gallstones, gallbladder wall thickening, or pericholecystic fluid. No biliary dilatation. Pancreas: No focal lesion. Normal pancreatic contour. No surrounding inflammatory changes. No main pancreatic ductal dilatation. Spleen: Normal in size without focal abnormality. Adrenals/Urinary Tract: Adrenal glands are unremarkable. Pericentimeter fluid density lesions likely represent simple renal cysts. Otherwise the kidneys are normal, without renal calculi, focal lesion, or hydronephrosis. Bladder is unremarkable. Stomach/Bowel: Stomach is within normal limits. No evidence of bowel wall thickening  or dilatation. The appendix not definitely identified. Scattered colonic diverticulosis. Vascular/Lymphatic: No abdominal aorta or iliac aneurysm. Mild atherosclerotic plaque of the aorta and its branches. No abdominal, pelvic, or inguinal lymphadenopathy. Reproductive: Coarsely calcified lesion within the uterus likely represents a uterine fibroid. Uterus and bilateral adnexa are unremarkable. Other: No intraperitoneal free fluid. No intraperitoneal free gas. No organized fluid collection. Musculoskeletal: Tiny fat containing umbilical hernia. No suspicious lytic or blastic osseous lesions. No acute displaced fracture. Multilevel degenerative changes of the spine. L5-S1 posterolateral fusion. Review of the MIP images confirms the above findings. IMPRESSION: 1. No pulmonary embolus. 2. 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 3. Scattered colonic diverticulosis with no acute diverticulitis. 4. Degenerative uterine fibroid. 5. Otherwise no acute intra-abdominal or intrapelvic abnormality. Electronically Signed   By: Tish Frederickson M.D.   On: 02/09/2021 16:21   CT ABDOMEN PELVIS W CONTRAST  Result Date: 02/09/2021 CLINICAL DATA:  Pulmonary embolus suspected. Soreness of breath and sharp pain in left upper quadrant for a week. EXAM: CT ANGIOGRAPHY CHEST CT ABDOMEN AND PELVIS WITH CONTRAST TECHNIQUE: Multidetector CT imaging of the chest was performed using the standard protocol during bolus administration of intravenous contrast. Multiplanar CT image reconstructions and MIPs were obtained to evaluate the vascular anatomy. Multidetector CT imaging of the abdomen and pelvis was performed using the standard protocol during bolus administration of intravenous contrast. CONTRAST:  OMNIPAQUE IOHEXOL 350 MG/ML SOLN COMPARISON:  CT chest 11/12/2020 FINDINGS: CTA CHEST FINDINGS Cardiovascular: Satisfactory opacification of the pulmonary arteries to the segmental level. No  evidence of pulmonary embolism. The main pulmonary artery measures at the upper limits of normal. Normal heart size. No significant pericardial effusion. The thoracic aorta is normal in caliber. Mild atherosclerotic plaque of the thoracic aorta. Mediastinum/Nodes: Redemonstration of a precardiac/upper abdominal fluid density lesion measuring approximately 5 x 3.5 x 5.5 cm (11:11, 15:69) likely representing a pericardial cyst. No enlarged mediastinal, hilar, or axillary lymph nodes. Thyroid gland, trachea, and esophagus demonstrate no significant findings. Lungs/Pleura: Expiratory phase of respiration. Subsegmental atelectasis. Mosaic attenuation due to low lung volumes. Nonspecific thin walled cystic lesion within the right lower lobe. No definite pulmonary mass. No pulmonary nodule. No pleural effusion. No pneumothorax. Pleural effusion or pneumothorax. Musculoskeletal: No chest wall abnormality. Lower thoracic vertebral body hemangioma noted. No acute or significant osseous findings. Review of the MIP images confirms the above findings. CT ABDOMEN and PELVIS FINDINGS Hepatobiliary: No focal liver abnormality. No gallstones, gallbladder wall thickening, or pericholecystic fluid. No biliary dilatation.  Pancreas: No focal lesion. Normal pancreatic contour. No surrounding inflammatory changes. No main pancreatic ductal dilatation. Spleen: Normal in size without focal abnormality. Adrenals/Urinary Tract: Adrenal glands are unremarkable. Pericentimeter fluid density lesions likely represent simple renal cysts. Otherwise the kidneys are normal, without renal calculi, focal lesion, or hydronephrosis. Bladder is unremarkable. Stomach/Bowel: Stomach is within normal limits. No evidence of bowel wall thickening or dilatation. The appendix not definitely identified. Scattered colonic diverticulosis. Vascular/Lymphatic: No abdominal aorta or iliac aneurysm. Mild atherosclerotic plaque of the aorta and its branches. No  abdominal, pelvic, or inguinal lymphadenopathy. Reproductive: Coarsely calcified lesion within the uterus likely represents a uterine fibroid. Uterus and bilateral adnexa are unremarkable. Other: No intraperitoneal free fluid. No intraperitoneal free gas. No organized fluid collection. Musculoskeletal: Tiny fat containing umbilical hernia. No suspicious lytic or blastic osseous lesions. No acute displaced fracture. Multilevel degenerative changes of the spine. L5-S1 posterolateral fusion. Review of the MIP images confirms the above findings. IMPRESSION: 1. No pulmonary embolus. 2. 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 3. Scattered colonic diverticulosis with no acute diverticulitis. 4. Degenerative uterine fibroid. 5. Otherwise no acute intra-abdominal or intrapelvic abnormality. Electronically Signed   By: Tish Frederickson M.D.   On: 02/09/2021 16:21   DG Chest Port 1 View  Result Date: 02/09/2021 CLINICAL DATA:  Shortness of breath EXAM: PORTABLE CHEST 1 VIEW COMPARISON:  06/25/2020 FINDINGS: Heart size upper limits of normal. No pulmonary vascular congestion. Lungs clear. IMPRESSION: Borderline cardiomegaly. Electronically Signed   By: Acquanetta Belling M.D.   On: 02/09/2021 14:07   ECHOCARDIOGRAM COMPLETE  Result Date: 02/10/2021    ECHOCARDIOGRAM REPORT   Patient Name:   Autumn Barton Date of Exam: 02/10/2021 Medical Rec #:  [id number]       Height:       62.0 in Accession #:    [id number]      Weight:       220.7 lb Date of Birth:  1946/08/16        BSA:          1.993 m Patient Age:    74 years        BP:           134/73 mmHg Patient Gender: F               HR:           72 bpm. Exam Location:  Inpatient Procedure: 2D Echo, Cardiac Doppler and Color Doppler Indications:    Dyspnea R06.00  History:        Patient has no prior history of Echocardiogram examinations.                 Risk Factors:Diabetes.  Sonographer:    Elmarie Shiley Dance Referring Phys: [id number] Sunjai Levandoski IMPRESSIONS  1. Left ventricular ejection fraction, by estimation, is 60 to 65%. The left ventricle has normal function. The left ventricle has no regional wall motion abnormalities. There is mild concentric left ventricular hypertrophy. Left ventricular diastolic parameters are consistent with Grade II diastolic dysfunction (pseudonormalization). Elevated left atrial pressure.  2. Right ventricular systolic function is normal. The right ventricular size is normal. There is normal pulmonary artery systolic pressure.  3. Left atrial size was moderately dilated.  4. Right atrial size was mildly dilated.  5. The mitral valve is normal in structure. Mild to moderate mitral valve regurgitation.  6. The aortic valve is tricuspid. There is moderate calcification of the aortic valve.  There is moderate thickening of the aortic valve. Aortic valve regurgitation is not visualized. Mild to moderate aortic valve sclerosis/calcification is present, without any evidence of aortic stenosis. FINDINGS  Left Ventricle: Left ventricular ejection fraction, by estimation, is 60 to 65%. The left ventricle has normal function. The left ventricle has no regional wall motion abnormalities. The left ventricular internal cavity size was normal in size. There is  mild concentric left ventricular hypertrophy. Left ventricular diastolic parameters are consistent with Grade II diastolic dysfunction (pseudonormalization). Elevated left atrial pressure. Right Ventricle: The right ventricular size is normal. No increase in right ventricular wall thickness. Right ventricular systolic function is normal. There is normal pulmonary artery systolic pressure. The tricuspid regurgitant velocity is 2.65 m/s, and  with an assumed right atrial pressure of 3 mmHg, the estimated right ventricular systolic pressure is 31.1 mmHg. Left Atrium: Left atrial size was moderately dilated. Right Atrium: Right atrial size was mildly dilated. Pericardium: There is  no evidence of pericardial effusion. Mitral Valve: The mitral valve is normal in structure. Mild to moderate mitral valve regurgitation, with centrally-directed jet. Tricuspid Valve: The tricuspid valve is normal in structure. Tricuspid valve regurgitation is not demonstrated. Aortic Valve: The aortic valve is tricuspid. There is moderate calcification of the aortic valve. There is moderate thickening of the aortic valve. Aortic valve regurgitation is not visualized. Mild to moderate aortic valve sclerosis/calcification is present, without any evidence of aortic stenosis. Pulmonic Valve: The pulmonic valve was normal in structure. Pulmonic valve regurgitation is not visualized. Aorta: The aortic root is normal in size and structure. IAS/Shunts: No atrial level shunt detected by color flow Doppler.  LEFT VENTRICLE PLAX 2D LVIDd:         4.60 cm  Diastology LVIDs:         3.10 cm  LV e' medial:    9.68 cm/s LV PW:         1.20 cm  LV E/e' medial:  14.4 LV IVS:        1.30 cm  LV e' lateral:   12.50 cm/s LVOT diam:     1.90 cm  LV E/e' lateral: 11.1 LV SV:         54 LV SV Index:   27 LVOT Area:     2.84 cm  RIGHT VENTRICLE             IVC RV Basal diam:  3.30 cm     IVC diam: 1.60 cm RV Mid diam:    2.40 cm RV S prime:     11.00 cm/s TAPSE (M-mode): 1.9 cm LEFT ATRIUM             Index       RIGHT ATRIUM           Index LA diam:        4.70 cm 2.36 cm/m  RA Area:     20.40 cm LA Vol (A2C):   44.8 ml 22.47 ml/m RA Volume:   56.50 ml  28.34 ml/m LA Vol (A4C):   60.9 ml 30.55 ml/m LA Biplane Vol: 53.8 ml 26.99 ml/m  AORTIC VALVE LVOT Vmax:   86.60 cm/s LVOT Vmean:  59.500 cm/s LVOT VTI:    0.190 m  AORTA Ao Root diam: 3.30 cm Ao Asc diam:  3.15 cm MITRAL VALVE                TRICUSPID VALVE MV Area (PHT): 3.38 cm     TR Peak  grad:   28.1 mmHg MV Decel Time: 225 msec     TR Vmax:        265.00 cm/s MV E velocity: 139.00 cm/s MV A velocity: 88.60 cm/s   SHUNTS MV E/A ratio:  1.57         Systemic VTI:  0.19 m                              Systemic Diam: 1.90 cm Rachelle Hora Croitoru MD Electronically signed by Thurmon Fair MD Signature Date/Time: 02/10/2021/3:27:44 PM    Final     Scheduled Meds: . amLODipine  5 mg Oral q morning  . cyanocobalamin  1,000 mcg Subcutaneous Q30 days  . insulin aspart  0-9 Units Subcutaneous Q4H  . mometasone-formoterol  2 puff Inhalation BID  . montelukast  10 mg Oral q morning  . pantoprazole  40 mg Oral BID  . [START ON 02/15/2021] Vitamin D (Ergocalciferol)  50,000 Units Oral Q Sun   Continuous Infusions: . sodium chloride 10 mL/hr at 02/09/21 1730     LOS: 2 days   Time spent: 30 minutes   Hughie Closs, MD [facility]  02/11/2021, 1:23 PM   To contact the attending provider between 7A-7P or the covering provider during after hours 7P-7A, please log into the web site [email or web address].

## 2021-02-11 NOTE — Transfer of Care (Signed)
Immediate Anesthesia Transfer of Care Note  Patient: Autumn Barton  Procedure(s) Performed: COLONOSCOPY WITH PROPOFOL (N/A ) ESOPHAGOGASTRODUODENOSCOPY (EGD) WITH PROPOFOL (N/A ) BIOPSY  Patient Location: Endoscopy Unit  Anesthesia Type:MAC  Level of Consciousness: awake, alert  and oriented  Airway & Oxygen Therapy: Patient Spontanous Breathing and Patient connected to face mask oxygen  Post-op Assessment: Report given to RN and Post -op Vital signs reviewed and stable  Post vital signs: Reviewed and stable  Last Vitals:  Vitals Value Taken Time  BP    Temp    Pulse    Resp    SpO2      Last Pain:  Vitals:   02/11/21 0920  TempSrc: Temporal  PainSc: 0-No pain         Complications: No complications documented.

## 2021-02-11 NOTE — Op Note (Addendum)
[facility] Patient Name: Autumn Barton Procedure Date : 02/11/2021 MRN: [id number] Attending MD: Tressia Danas MD, MD Date of Birth: 06-25-1946 CSN: [id number] Age: 75 Admit Type: Inpatient Procedure:                Colonoscopy Indications:              Heme positive stool, symptomatic anemia Providers:                Tressia Danas MD, MD, Dayton Bailiff, RN,                            Fransisca Connors, Leanne Lovely, Technician,                            Albertina Senegal. Beckner, CRNA Referring MD:              Medicines:                Monitored Anesthesia Care Complications:            No immediate complications. Estimated Blood Loss:     Estimated blood loss: none. Procedure:                Pre-Anesthesia Assessment:                           - Prior to the procedure, a History and Physical                            was performed, and patient medications and                            allergies were reviewed. The patient's tolerance of                            previous anesthesia was also reviewed. The risks                            and benefits of the procedure and the sedation                            options and risks were discussed with the patient.                            All questions were answered, and informed consent                            was obtained. Prior Anticoagulants: The patient has                            taken Xarelto (rivaroxaban), last dose was 4 days                            prior to procedure. ASA Grade Assessment: III - A  patient with severe systemic disease. After                            reviewing the risks and benefits, the patient was                            deemed in satisfactory condition to undergo the                            procedure.                           After obtaining informed consent, the colonoscope                            was passed under direct vision.  Throughout the                            procedure, the patient's blood pressure, pulse, and                            oxygen saturations were monitored continuously. The                            CF-HQ190L ([id number] Olympus colonoscope was                            introduced through the anus and advanced to the 10                            cm into the ileum. The colonoscopy was performed                            without difficulty. The patient tolerated the                            procedure well. The quality of the bowel                            preparation was good. The terminal ileum, ileocecal                            valve, appendiceal orifice, and rectum were                            photographed. Scope In: 10:49:49 AM Scope Out: 11:05:23 AM Scope Withdrawal Time: 0 hours 11 minutes 43 seconds  Total Procedure Duration: 0 hours 15 minutes 34 seconds  Findings:      The perianal and digital rectal examinations were normal.      Multiple small and large-mouthed diverticula were found in the sigmoid       colon and descending colon. No evidence for active or recent bleeding.      Residual stool present throughout the colon. It was brown with a reddish       appearance.  No frank blood or hematin was found in the entire colon.      The terminal ileum appeared normal. However, the brownish stool with a       reddish appearance was present in the most distal TI. No red blood seen.      The exam was otherwise without abnormality on direct and retroflexion       views. Impression:               - Diverticulosis in the sigmoid colon and in the                            descending colon. With evidence for recent bleeding.                           - Blood-tinged stool present in the entire examined                            colon.                           - The examined portion of the ileum was normal. Recommendation:           - Patient has a contact number available for                             emergencies. The signs and symptoms of potential                            delayed complications were discussed with the                            patient. Return to normal activities tomorrow.                            Written discharge instructions were provided to the                            patient.                           - Resume previous diet.                           - Continue present medications.                           - Proceed with capsule endoscopy as an inpatient                            (scheduled by primary outpatient GI as an                            outpatient study next week prior to this                            hospitalization).  The findings and recommendations were discussed                            with the patient's daughter by phone. Procedure Code(s):        --- Professional ---                           [phone], Colonoscopy, flexible; diagnostic, including                            collection of specimen(s) by brushing or washing,                            when performed (separate procedure) Diagnosis Code(s):        --- Professional ---                           K92.2, Gastrointestinal hemorrhage, unspecified                           R19.5, Other fecal abnormalities                           K57.30, Diverticulosis of large intestine without                            perforation or abscess without bleeding CPT copyright 2019 [employer] Association. All rights reserved. The codes documented in this report are preliminary and upon coder review may  be revised to meet current compliance requirements. Tressia Danas MD, MD 02/11/2021 11:22:03 AM This report has been signed electronically. Number of Addenda: 0

## 2021-02-11 NOTE — Anesthesia Preprocedure Evaluation (Addendum)
Anesthesia Evaluation  Patient identified by MRN, date of birth, ID band Patient awake    Reviewed: Allergy & Precautions, NPO status , Patient's Chart, lab work & pertinent test results, reviewed documented beta blocker date and time   Airway Mallampati: I  TM Distance: >3 FB Neck ROM: Full    Dental no notable dental hx. (+) Edentulous Upper, Edentulous Lower   Pulmonary asthma ,    Pulmonary exam normal breath sounds clear to auscultation + decreased breath sounds      Cardiovascular hypertension, Pt. on medications Normal cardiovascular exam+ dysrhythmias Atrial Fibrillation  Rhythm:Regular Rate:Normal  EKG 02/10/21 Atrial fibrillation, RBBB pattern   Neuro/Psych negative neurological ROS  negative psych ROS   GI/Hepatic Neg liver ROS, FOBT +   Endo/Other  diabetes, Well Controlled, Type 2, Oral Hypoglycemic AgentsMorbid obesity  Renal/GU Renal InsufficiencyRenal disease  negative genitourinary   Musculoskeletal negative musculoskeletal ROS (+)   Abdominal (+) + obese,   Peds  Hematology  (+) anemia , Xarelto therapy- last dose 4/3   Anesthesia Other Findings   Reproductive/Obstetrics                            Anesthesia Physical Anesthesia Plan  ASA: III  Anesthesia Plan: MAC   Post-op Pain Management:    Induction: Intravenous  PONV Risk Score and Plan: 2 and Propofol infusion, Treatment may vary due to age or medical condition and Ondansetron  Airway Management Planned: Natural Airway and Simple Face Mask  Additional Equipment: None  Intra-op Plan:   Post-operative Plan:   Informed Consent: I have reviewed the patients History and Physical, chart, labs and discussed the procedure including the risks, benefits and alternatives for the proposed anesthesia with the patient or authorized representative who has indicated his/her understanding and acceptance.       Plan  Discussed with: CRNA and Anesthesiologist  Anesthesia Plan Comments:        Anesthesia Quick Evaluation

## 2021-02-11 NOTE — Progress Notes (Signed)
Pt ingested pill cam at 1130. Instructions given to patient and bedside RN.    Roselie Awkward, RN

## 2021-02-11 NOTE — Progress Notes (Signed)
Pt came back to rm 19 from endoscopy. Reinitiated tele. VSS. Call bell within reach.   Lawson Radar, RN

## 2021-02-11 NOTE — Evaluation (Signed)
Occupational Therapy Evaluation Patient Details Name: Autumn Barton MRN: [id number] DOB: 1946-07-13 Today's Date: 02/11/2021    History of Present Illness Pt is a 75 y/o female admitted 4/4 secondary to SOB and abdominal pain. Found to have symptomatic anemia likely secondary to GI bleed. Pt is s/p colonoscopy and EGD on 4/6 and having capsule study on 4/6. PMH includes DM, DVT, HTN, and COVID.   Clinical Impression   Patient admitted for the diagnosis and study listed above.  PTA she was very active, lived alone, and had assist as needed from family and friends.  Generally she is independent with ADL and IADL, Mod I with mobility and does not drive.  Barriers are listed below.  Currently, she is needing up to supervision for in room mobility, and up to [place] for lower body ADL.  She has adequate help at home from friends and family.  OT will follow in the acute setting to ensure a safe discharge at max functional level.  No post acute OT is anticipated.      Follow Up Recommendations  No OT follow up    Equipment Recommendations  None recommended by OT    Recommendations for Other Services       Precautions / Restrictions Precautions Precautions: Fall Restrictions Weight Bearing Restrictions: No      Mobility Bed Mobility               General bed mobility comments: Up in recliner Patient Response: Cooperative  Transfers Overall transfer level: Needs assistance Equipment used: None Transfers: Sit to/from Stand Sit to Stand: Min guard         General transfer comment: close supervision for in room mobility.    Balance Overall balance assessment: Mild deficits observed, not formally tested                                         ADL either performed or assessed with clinical judgement   ADL Overall ADL's : Needs assistance/impaired Eating/Feeding: Independent   Grooming: Set up;Sitting;Standing   Upper Body Bathing: Independent   Lower  Body Bathing: Minimal assistance;Sit to/from stand   Upper Body Dressing : Independent   Lower Body Dressing: Minimal assistance;Sit to/from stand               Functional mobility during ADLs: Supervision/safety;Rolling walker       Vision Baseline Vision/History: Wears glasses Wears Glasses: At all times Patient Visual Report: No change from baseline       Perception     Praxis      Pertinent Vitals/Pain Pain Assessment: No/denies pain     Hand Dominance Right   Extremity/Trunk Assessment Upper Extremity Assessment Upper Extremity Assessment: Overall WFL for tasks assessed   Lower Extremity Assessment Lower Extremity Assessment: Defer to PT evaluation   Cervical / Trunk Assessment Cervical / Trunk Assessment: Normal   Communication Communication Communication: No difficulties   Cognition Arousal/Alertness: Awake/alert Behavior During Therapy: WFL for tasks assessed/performed Overall Cognitive Status: Within Functional Limits for tasks assessed                                     General Comments  Educated about use of rollator at home to increase safety and activity tolerance.    Exercises     Shoulder  Instructions      Home Living Family/patient expects to be discharged to:: Private residence Living Arrangements: Alone Available Help at Discharge: Family;Neighbor;Available PRN/intermittently Type of Home: House Home Access: Stairs to enter [employer] of Steps: 1 Entrance Stairs-Rails: None Home Layout: One level     Bathroom Shower/Tub: Teacher, early years/pre: Handicapped height     Home Equipment: Clinical cytogeneticist - 2 wheels;Walker - 4 wheels;Cane - single point;Adaptive equipment Adaptive Equipment: Reacher;Sock aid;Long-handled shoe horn;Long-handled sponge        Prior Functioning/Environment Level of Independence: Independent with assistive device(s)        Comments: Uses AD  occasionally depending on the day. Does not drive. Uses lyft to get to appointments.  Uses hip kit as needed.        OT Problem List: Decreased activity tolerance      OT Treatment/Interventions: Self-care/ADL training;Balance training;Therapeutic activities    OT Goals(Current goals can be found in the care plan section) Acute Rehab OT Goals Patient Stated Goal: Just be able to get back to being active OT Goal Formulation: With patient Time For Goal Achievement: 02/25/21 Potential to Achieve Goals: Good ADL Goals Pt Will Perform Lower Body Bathing: with modified independence;sit to/from stand Pt Will Perform Lower Body Dressing: with modified independence;sit to/from stand Pt Will Transfer to Toilet: with modified independence;ambulating Pt Will Perform Toileting - Clothing Manipulation and hygiene: with modified independence;sit to/from stand  OT Frequency: Min 2X/week   Barriers to D/C:    none noted       Co-evaluation              AM-PAC OT "6 Clicks" Daily Activity     Outcome Measure Help from another person eating meals?: None Help from another person taking care of personal grooming?: None Help from another person toileting, which includes using toliet, bedpan, or urinal?: None Help from another person bathing (including washing, rinsing, drying)?: A Little Help from another person to put on and taking off regular upper body clothing?: None Help from another person to put on and taking off regular lower body clothing?: A Little 6 Click Score: 22   End of Session    Activity Tolerance: Patient tolerated treatment well Patient left: in chair;with call bell/phone within reach;with nursing/sitter in room  OT Visit Diagnosis: Unsteadiness on feet (R26.81)                Time: 1694-5038 OT Time Calculation (min): 17 min Charges:  OT General Charges $OT Visit: 1 Visit OT Evaluation $OT Eval Moderate Complexity: 1 Mod  02/11/2021  Rich, OTR/L  Acute  Rehabilitation Services  Office:  [phone]   Metta Clines 02/11/2021, 3:57 PM

## 2021-02-11 NOTE — Evaluation (Signed)
Physical Therapy Evaluation Patient Details Name: Autumn Barton MRN: [id number] DOB: Mar 10, 1946 Today's Date: 02/11/2021   History of Present Illness  Pt is a 75 y/o female admitted 4/4 secondary to SOB and abdominal pain. Found to have symptomatic anemia likely secondary to GI bleed. Pt is s/p colonoscopy and EGD on 4/6 and having capsule study on 4/6. PMH includes DM, DVT, HTN, and COVID.  Clinical Impression  Pt admitted secondary to problem above with deficits below. Pt requiring min guard A for mobility tasks within the room. Pt with increased fatigue and SOB during short distance gait. Oxygen sats at 96% on RA. Anticipate pt will progress well once symptoms improve. Will continue to follow acutely.     Follow Up Recommendations No PT follow up;Supervision - Intermittent    Equipment Recommendations  None recommended by PT    Recommendations for Other Services       Precautions / Restrictions Precautions Precautions: Fall Restrictions Weight Bearing Restrictions: No      Mobility  Bed Mobility               General bed mobility comments: Sitting EOB upon entry.    Transfers Overall transfer level: Needs assistance Equipment used: None Transfers: Sit to/from Stand Sit to Stand: Min guard         General transfer comment: Min guard for safety. No overt LOB noted.  Ambulation/Gait Ambulation/Gait assistance: Min guard Gait Distance (Feet): 20 Feet Assistive device: None Gait Pattern/deviations: Step-through pattern;Decreased stride length Gait velocity: Decreased   General Gait Details: Increased fatigue and SOB noted during short distance ambulation to bathroom and back to recliner. Pt oxygen sats at 96 % on RA. Further mobility limited.  Stairs            Wheelchair Mobility    Modified Rankin (Stroke Patients Only)       Balance Overall balance assessment: Mild deficits observed, not formally tested                                            Pertinent Vitals/Pain Pain Assessment: No/denies pain    Home Living Family/patient expects to be discharged to:: Private residence Living Arrangements: Alone Available Help at Discharge: Family;Neighbor;Available PRN/intermittently Type of Home: House Home Access: Stairs to enter Entrance Stairs-Rails: None Entrance Stairs-Number of Steps: 1 Home Layout: One level Home Equipment: Emergency planning/management officer - 2 wheels;Walker - 4 wheels;Cane - single point      Prior Function Level of Independence: Independent with assistive device(s)         Comments: Uses AD occasionally depending on the day. Does not drive. Uses lyft to get to appointments.     Hand Dominance        Extremity/Trunk Assessment   Upper Extremity Assessment Upper Extremity Assessment: Defer to OT evaluation    Lower Extremity Assessment Lower Extremity Assessment: Generalized weakness    Cervical / Trunk Assessment Cervical / Trunk Assessment: Normal  Communication   Communication: No difficulties  Cognition Arousal/Alertness: Awake/alert Behavior During Therapy: WFL for tasks assessed/performed Overall Cognitive Status: Within Functional Limits for tasks assessed                                        General Comments General comments (skin integrity, edema, etc.): Educated  about use of rollator at home to increase safety and activity tolerance.    Exercises     Assessment/Plan    PT Assessment Patient needs continued PT services  PT Problem List Decreased activity tolerance;Decreased mobility;Decreased strength;Decreased knowledge of use of DME;Cardiopulmonary status limiting activity       PT Treatment Interventions DME instruction;Gait training;Stair training;Functional mobility training;Therapeutic exercise;Therapeutic activities;Balance training;Patient/family education    PT Goals (Current goals can be found in the Care Plan section)  Acute Rehab PT  Goals Patient Stated Goal: to feel better and go home PT Goal Formulation: With patient Time For Goal Achievement: 02/25/21 Potential to Achieve Goals: Good    Frequency Min 3X/week   Barriers to discharge        Co-evaluation               AM-PAC PT "6 Clicks" Mobility  Outcome Measure Help needed turning from your back to your side while in a flat bed without using bedrails?: None Help needed moving from lying on your back to sitting on the side of a flat bed without using bedrails?: None Help needed moving to and from a bed to a chair (including a wheelchair)?: A Little Help needed standing up from a chair using your arms (e.g., wheelchair or bedside chair)?: A Little Help needed to walk in hospital room?: A Little Help needed climbing 3-5 steps with a railing? : A Little 6 Click Score: 20    End of Session   Activity Tolerance: Patient limited by fatigue Patient left: in chair;with call bell/phone within reach Nurse Communication: Mobility status PT Visit Diagnosis: Other abnormalities of gait and mobility (R26.89)    Time: 1439-1450 PT Time Calculation (min) (ACUTE ONLY): 11 min   Charges:   PT Evaluation $PT Eval Low Complexity: 1 Low          Cindee Salt, DPT  Acute Rehabilitation Services  Pager: [phone] Office: [phone]   Lehman Prom 02/11/2021, 2:57 PM

## 2021-02-11 NOTE — Anesthesia Postprocedure Evaluation (Signed)
Anesthesia Post Note  Patient: Autumn Barton  Procedure(s) Performed: COLONOSCOPY WITH PROPOFOL (N/A ) ESOPHAGOGASTRODUODENOSCOPY (EGD) WITH PROPOFOL (N/A ) BIOPSY     Patient location during evaluation: PACU Anesthesia Type: MAC Level of consciousness: awake and alert and oriented Pain management: pain level controlled Vital Signs Assessment: post-procedure vital signs reviewed and stable Respiratory status: nonlabored ventilation, respiratory function stable, spontaneous breathing and patient connected to nasal cannula oxygen Cardiovascular status: stable and blood pressure returned to baseline Postop Assessment: no apparent nausea or vomiting Anesthetic complications: no   No complications documented.  Last Vitals:  Vitals:   02/11/21 1127 02/11/21 1128  BP: (!) 120/94 (!) 120/94  Pulse: 95 84  Resp: 19 19  Temp:    SpO2: 100% 100%    Last Pain:  Vitals:   02/11/21 1127  TempSrc:   PainSc: 0-No pain                 Sarabella Caprio A.

## 2021-02-12 ENCOUNTER — Encounter (HOSPITAL_COMMUNITY): Payer: Self-pay | Admitting: Gastroenterology

## 2021-02-12 DIAGNOSIS — K558 Other vascular disorders of intestine: Secondary | ICD-10-CM

## 2021-02-12 DIAGNOSIS — D509 Iron deficiency anemia, unspecified: Secondary | ICD-10-CM

## 2021-02-12 DIAGNOSIS — K5520 Angiodysplasia of colon without hemorrhage: Secondary | ICD-10-CM

## 2021-02-12 LAB — BASIC METABOLIC PANEL
Anion gap: 6 (ref 5–15)
BUN: 12 mg/dL (ref 8–23)
CO2: 22 mmol/L (ref 22–32)
Calcium: 8.7 mg/dL — ABNORMAL LOW (ref 8.9–10.3)
Chloride: 106 mmol/L (ref 98–111)
Creatinine, Ser: 1.42 mg/dL — ABNORMAL HIGH (ref 0.44–1.00)
GFR, Estimated: 38 mL/min — ABNORMAL LOW (ref >60)
Glucose, Bld: 122 mg/dL — ABNORMAL HIGH (ref 70–99)
Potassium: 3.9 mmol/L (ref 3.5–5.1)
Sodium: 134 mmol/L — ABNORMAL LOW (ref 135–145)

## 2021-02-12 LAB — SURGICAL PATHOLOGY

## 2021-02-12 LAB — CBC
HCT: 31.5 % — ABNORMAL LOW (ref 36.0–46.0)
Hemoglobin: 9.2 g/dL — ABNORMAL LOW (ref 12.0–15.0)
MCH: 21.3 pg — ABNORMAL LOW (ref 26.0–34.0)
MCHC: 29.2 g/dL — ABNORMAL LOW (ref 30.0–36.0)
MCV: 73.1 fL — ABNORMAL LOW (ref 80.0–100.0)
Platelets: 304 10*3/uL (ref 150–400)
RBC: 4.31 MIL/uL (ref 3.87–5.11)
RDW: 20.5 % — ABNORMAL HIGH (ref 11.5–15.5)
WBC: 8.7 10*3/uL (ref 4.0–10.5)
nRBC: 0.2 % (ref 0.0–0.2)

## 2021-02-12 LAB — GLUCOSE, CAPILLARY: Glucose-Capillary: 118 mg/dL — ABNORMAL HIGH (ref 70–99)

## 2021-02-12 MED ORDER — FERROUS SULFATE 325 (65 FE) MG PO TABS: 325.0000 mg | ORAL_TABLET | Freq: Every day | ORAL | 0 refills | Status: AC

## 2021-02-12 MED ORDER — PANTOPRAZOLE SODIUM 40 MG PO TBEC: 40.0000 mg | DELAYED_RELEASE_TABLET | Freq: Every day | ORAL | 0 refills | Status: AC

## 2021-02-12 NOTE — Progress Notes (Signed)
Video Capsule Endoscopy:  Formal report will be scanned into EMR  Summary and Recommendations:  Complete capsule study.  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 Very rapid small bowel transit .  Certainly AVM's may be the source of bleed loss especially insetting of  chronic anticoagulation. No amenable to deep enteroscopy.  Pt will follow up with [facility]. Follow outpatient iron studies and serially Hgb Defer to her outpatient GI team.  Study read and interpreted by Erick Blinks, MD

## 2021-02-12 NOTE — Discharge Summary (Addendum)
Physician Discharge Summary  Autumn Barton:[id number] DOB: [birth year] DOA: 02/09/2021  PCP: Milta Deiters, FNP  Admit date: 02/09/2021 Discharge date: 02/12/2021 30 Day Unplanned Readmission Risk Score   Flowsheet Row ED to Hosp-Admission (Current) from 02/09/2021 in [facility] 4E CV SURGICAL PROGRESSIVE CARE  30 Day Unplanned Readmission Risk Score (%) 15.97 Filed at 02/12/2021 0801     This score is the patient's risk of an unplanned readmission within 30 days of being discharged (0 -100%). The score is based on dignosis, age, lab data, medications, orders, and past utilization.   Low:  0-14.9   Medium: 15-21.9   High: 22-29.9   Extreme: 30 and above         Admitted From: Home Disposition: Home  Recommendations for Outpatient Follow-up:  1. Follow up with PCP in 1-2 weeks 2. Follow-up with your primary hematologist as soon as possible 3. Please obtain BMP/CBC in one week 4. Please follow up with your PCP on the following pending results: Unresulted Labs (From admission, onward)         None        Home Health: None Equipment/Devices: None  Discharge Condition: Stable CODE STATUS: Full code Diet recommendation: Cardiac  Subjective: Seen and examined.  Feels better.  No new complaint.  Ready to go home.  Brief/Interim Summary: Autumn Barton a 75 y.o.femalewith medical history significant ofDM2, asthma, DVT/PE back in Jan 2021 in setting of COVID-19 illness now on Xarelto presented to ED with c/o SOB, Abd pain / CP. LUQ And LL chest pain. Symptoms onset some months ago but worsening for the past 3 days. Worked up as outpt, noted to have iron def anemia, supposed to see GI doc at [facility] this week but came in to ED due to worsening symptoms. SOB worse with exertion.   She has had melena for the past 3 weeks she relates. Does have somewhat chronic use of NSAIDS due to chronic back pain.  Upon arrival to ED, hemoglobin 7.6, Hemoccult positive.  Admitted to hospital service.   GI consulted. Transfused 2 units of PRBC on 02/09/2021 and hemoglobin has remained over 9 ever since..  Underwent colonoscopy and EGD on 02/11/2021 which were both unremarkable.  Swallowed capsule for capsule endoscopy yesterday.  Results not available yet.  Discussed with GI, since her hemoglobin is stable, she is cleared to be discharged.  GI will call her with results of the capsule endoscopy.  Since she was taking Xarelto only for previously history of PE DVT in this time CT angiogram of the chest did not show any PE, it would be safer to hold her anticoagulation until she hears from GI with capsule endoscopy results.  Based on the results, she may discuss with GI or her PCP to either resume or stop her anticoagulation.  I am also starting her on Feosol.  She is advised to follow-up with her hematologist at [facility] as well as PCP.  Of note, CT chest also showed possibility of pericardial cyst of 5.5 cm.  Transthoracic echo was done which did not show any cyst and had normal ejection fraction with grade 2 diastolic dysfunction.  Chronic abdominal pain and tenderness: Patient complains of chronic abdominal pain and tenderness mostly on the left upper quadrant.  CT abdomen and pelvis with IV contrast was done during this hospitalization which did not show any acute pathology.  Discharge Diagnoses:  Principal Problem:   Symptomatic anemia Active Problems:   Diabetes mellitus (HCC)   Gastrointestinal hemorrhage with  melena   History of DVT (deep vein thrombosis)   HTN (hypertension)    Discharge Instructions   Allergies as of 02/12/2021      Reactions   Adhesive [tape] Other (See Comments)   BRUISES SKIN PER PATIENT REPORT PAPER TAPE OK PER PATIENT   Aspirin Nausea Only   Ibuprofen Nausea Only      Medication List    STOP taking these medications   omeprazole 20 MG capsule Commonly known as: PRILOSEC   rivaroxaban 20 MG Tabs tablet Commonly known as: XARELTO     TAKE these  medications   acetaminophen 500 MG tablet Commonly known as: TYLENOL Take 1,000 mg by mouth every 6 (six) hours as needed for headache (pain).   albuterol 108 (90 Base) MCG/ACT inhaler Commonly known as: ProAir HFA Inhale 2 puffs into the lungs every 4 (four) hours as needed for wheezing or shortness of breath.   amLODipine 5 MG tablet Commonly known as: NORVASC Take 5 mg by mouth every morning.   cyanocobalamin 1000 MCG/ML injection Commonly known as: (VITAMIN B-12) Inject 1,000 mcg into the skin every 30 (thirty) days. Notes to patient: Take as you were prior to admission    diclofenac Sodium 1 % Gel Commonly known as: VOLTAREN Apply 2 g topically 4 (four) times daily as needed (pain).   ferrous sulfate 325 (65 FE) MG tablet Take 1 tablet (325 mg total) by mouth daily with breakfast.   fluticasone 50 MCG/ACT nasal spray Commonly known as: FLONASE Place 2 sprays into both nostrils daily as needed for allergies or rhinitis.   hydrOXYzine 10 MG tablet Commonly known as: ATARAX/VISTARIL Take 10 mg by mouth 3 (three) times daily as needed for itching.   lidocaine 5 % Commonly known as: LIDODERM Place 1 patch onto the skin daily as needed (pain).   montelukast 10 MG tablet Commonly known as: SINGULAIR Take 10 mg by mouth every morning.   pantoprazole 40 MG tablet Commonly known as: PROTONIX Take 1 tablet (40 mg total) by mouth daily.   sitaGLIPtin 100 MG tablet Commonly known as: JANUVIA Take 100 mg by mouth every morning.   Symbicort 160-4.5 MCG/ACT inhaler Generic drug: budesonide-formoterol INHALE 2 PUFFS INTO THE LUNGS IN THE MORNING AND AT BEDTIME. USE WITH SPACER. RINSE, GARGLE AND SPIT OUT AFTER USE. What changed: See the new instructions.   Vitamin D (Ergocalciferol) 1.25 MG (50000 UNIT) Caps capsule Commonly known as: DRISDOL Take 50,000 Units by mouth every Sunday. Notes to patient: Take as you were prior to admission    zolpidem 5 MG tablet Commonly  known as: AMBIEN Take 5 mg by mouth at bedtime as needed for sleep.       Follow-up Information    Go to  [facility] [place] [facility].   Specialty: Emergency Medicine Why: If symptoms worsen Contact information: [address] [id number] [address] [place] [address] [phone]       Milta Deiters, FNP Follow up in 1 week(s).   Specialty: Family Medicine Contact information: [facility] [address] [place] [address] [phone]              Allergies  Allergen Reactions  . Adhesive [Tape] Other (See Comments)    BRUISES SKIN PER PATIENT REPORT PAPER TAPE OK PER PATIENT  . Aspirin Nausea Only  . Ibuprofen Nausea Only    Consultations: GI   Procedures/Studies: CT Angio Chest PE W and/or Wo Contrast  Result Date: 02/09/2021 CLINICAL  DATA:  Pulmonary embolus suspected. Soreness of breath and sharp pain in left upper quadrant for a week. EXAM: CT ANGIOGRAPHY CHEST CT ABDOMEN AND PELVIS WITH CONTRAST TECHNIQUE: Multidetector CT imaging of the chest was performed using the standard protocol during bolus administration of intravenous contrast. Multiplanar CT image reconstructions and MIPs were obtained to evaluate the vascular anatomy. Multidetector CT imaging of the abdomen and pelvis was performed using the standard protocol during bolus administration of intravenous contrast. CONTRAST:  OMNIPAQUE IOHEXOL 350 MG/ML SOLN COMPARISON:  CT chest 11/12/2020 FINDINGS: CTA CHEST FINDINGS Cardiovascular: Satisfactory opacification of the pulmonary arteries to the segmental level. No evidence of pulmonary embolism. The main pulmonary artery measures at the upper limits of normal. Normal heart size. No significant pericardial effusion. The thoracic aorta is normal in caliber. Mild atherosclerotic plaque of the thoracic aorta. Mediastinum/Nodes: Redemonstration of a precardiac/upper abdominal fluid density lesion measuring  approximately 5 x 3.5 x 5.5 cm (11:11, 15:69) likely representing a pericardial cyst. No enlarged mediastinal, hilar, or axillary lymph nodes. Thyroid gland, trachea, and esophagus demonstrate no significant findings. Lungs/Pleura: Expiratory phase of respiration. Subsegmental atelectasis. Mosaic attenuation due to low lung volumes. Nonspecific thin walled cystic lesion within the right lower lobe. No definite pulmonary mass. No pulmonary nodule. No pleural effusion. No pneumothorax. Pleural effusion or pneumothorax. Musculoskeletal: No chest wall abnormality. Lower thoracic vertebral body hemangioma noted. No acute or significant osseous findings. Review of the MIP images confirms the above findings. CT ABDOMEN and PELVIS FINDINGS Hepatobiliary: No focal liver abnormality. No gallstones, gallbladder wall thickening, or pericholecystic fluid. No biliary dilatation. Pancreas: No focal lesion. Normal pancreatic contour. No surrounding inflammatory changes. No main pancreatic ductal dilatation. Spleen: Normal in size without focal abnormality. Adrenals/Urinary Tract: Adrenal glands are unremarkable. Pericentimeter fluid density lesions likely represent simple renal cysts. Otherwise the kidneys are normal, without renal calculi, focal lesion, or hydronephrosis. Bladder is unremarkable. Stomach/Bowel: Stomach is within normal limits. No evidence of bowel wall thickening or dilatation. The appendix not definitely identified. Scattered colonic diverticulosis. Vascular/Lymphatic: No abdominal aorta or iliac aneurysm. Mild atherosclerotic plaque of the aorta and its branches. No abdominal, pelvic, or inguinal lymphadenopathy. Reproductive: Coarsely calcified lesion within the uterus likely represents a uterine fibroid. Uterus and bilateral adnexa are unremarkable. Other: No intraperitoneal free fluid. No intraperitoneal free gas. No organized fluid collection. Musculoskeletal: Tiny fat containing umbilical hernia. No  suspicious lytic or blastic osseous lesions. No acute displaced fracture. Multilevel degenerative changes of the spine. L5-S1 posterolateral fusion. Review of the MIP images confirms the above findings. IMPRESSION: 1. No pulmonary embolus. 2. 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 3. Scattered colonic diverticulosis with no acute diverticulitis. 4. Degenerative uterine fibroid. 5. Otherwise no acute intra-abdominal or intrapelvic abnormality. Electronically Signed   By: Tish Frederickson M.D.   On: 02/09/2021 16:21   CT ABDOMEN PELVIS W CONTRAST  Result Date: 02/09/2021 CLINICAL DATA:  Pulmonary embolus suspected. Soreness of breath and sharp pain in left upper quadrant for a week. EXAM: CT ANGIOGRAPHY CHEST CT ABDOMEN AND PELVIS WITH CONTRAST TECHNIQUE: Multidetector CT imaging of the chest was performed using the standard protocol during bolus administration of intravenous contrast. Multiplanar CT image reconstructions and MIPs were obtained to evaluate the vascular anatomy. Multidetector CT imaging of the abdomen and pelvis was performed using the standard protocol during bolus administration of intravenous contrast. CONTRAST:  OMNIPAQUE IOHEXOL 350 MG/ML SOLN COMPARISON:  CT chest 11/12/2020 FINDINGS: CTA CHEST FINDINGS Cardiovascular: Satisfactory  opacification of the pulmonary arteries to the segmental level. No evidence of pulmonary embolism. The main pulmonary artery measures at the upper limits of normal. Normal heart size. No significant pericardial effusion. The thoracic aorta is normal in caliber. Mild atherosclerotic plaque of the thoracic aorta. Mediastinum/Nodes: Redemonstration of a precardiac/upper abdominal fluid density lesion measuring approximately 5 x 3.5 x 5.5 cm (11:11, 15:69) likely representing a pericardial cyst. No enlarged mediastinal, hilar, or axillary lymph nodes. Thyroid gland, trachea, and esophagus demonstrate no significant findings.  Lungs/Pleura: Expiratory phase of respiration. Subsegmental atelectasis. Mosaic attenuation due to low lung volumes. Nonspecific thin walled cystic lesion within the right lower lobe. No definite pulmonary mass. No pulmonary nodule. No pleural effusion. No pneumothorax. Pleural effusion or pneumothorax. Musculoskeletal: No chest wall abnormality. Lower thoracic vertebral body hemangioma noted. No acute or significant osseous findings. Review of the MIP images confirms the above findings. CT ABDOMEN and PELVIS FINDINGS Hepatobiliary: No focal liver abnormality. No gallstones, gallbladder wall thickening, or pericholecystic fluid. No biliary dilatation. Pancreas: No focal lesion. Normal pancreatic contour. No surrounding inflammatory changes. No main pancreatic ductal dilatation. Spleen: Normal in size without focal abnormality. Adrenals/Urinary Tract: Adrenal glands are unremarkable. Pericentimeter fluid density lesions likely represent simple renal cysts. Otherwise the kidneys are normal, without renal calculi, focal lesion, or hydronephrosis. Bladder is unremarkable. Stomach/Bowel: Stomach is within normal limits. No evidence of bowel wall thickening or dilatation. The appendix not definitely identified. Scattered colonic diverticulosis. Vascular/Lymphatic: No abdominal aorta or iliac aneurysm. Mild atherosclerotic plaque of the aorta and its branches. No abdominal, pelvic, or inguinal lymphadenopathy. Reproductive: Coarsely calcified lesion within the uterus likely represents a uterine fibroid. Uterus and bilateral adnexa are unremarkable. Other: No intraperitoneal free fluid. No intraperitoneal free gas. No organized fluid collection. Musculoskeletal: Tiny fat containing umbilical hernia. No suspicious lytic or blastic osseous lesions. No acute displaced fracture. Multilevel degenerative changes of the spine. L5-S1 posterolateral fusion. Review of the MIP images confirms the above findings. IMPRESSION: 1. No  pulmonary embolus. 2. 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 3. Scattered colonic diverticulosis with no acute diverticulitis. 4. Degenerative uterine fibroid. 5. Otherwise no acute intra-abdominal or intrapelvic abnormality. Electronically Signed   By: Tish Frederickson M.D.   On: 02/09/2021 16:21   DG Chest Port 1 View  Result Date: 02/09/2021 CLINICAL DATA:  Shortness of breath EXAM: PORTABLE CHEST 1 VIEW COMPARISON:  06/25/2020 FINDINGS: Heart size upper limits of normal. No pulmonary vascular congestion. Lungs clear. IMPRESSION: Borderline cardiomegaly. Electronically Signed   By: Acquanetta Belling M.D.   On: 02/09/2021 14:07   ECHOCARDIOGRAM COMPLETE  Result Date: 02/10/2021    ECHOCARDIOGRAM REPORT   Patient Name:   Autumn Barton Date of Exam: 02/10/2021 Medical Rec #:  [id number]       Height:       62.0 in Accession #:    [id number]      Weight:       220.7 lb Date of Birth:  17-Dec-1945        BSA:          1.993 m Patient Age:    74 years        BP:           134/73 mmHg Patient Gender: F               HR:           72 bpm. Exam Location:  Inpatient Procedure: 2D Echo, Cardiac Doppler  and Color Doppler Indications:    Dyspnea R06.00  History:        Patient has no prior history of Echocardiogram examinations.                 Risk Factors:Diabetes.  Sonographer:    Elmarie Shiley Dance Referring Phys: [id number] Renn Stille IMPRESSIONS  1. Left ventricular ejection fraction, by estimation, is 60 to 65%. The left ventricle has normal function. The left ventricle has no regional wall motion abnormalities. There is mild concentric left ventricular hypertrophy. Left ventricular diastolic parameters are consistent with Grade II diastolic dysfunction (pseudonormalization). Elevated left atrial pressure.  2. Right ventricular systolic function is normal. The right ventricular size is normal. There is normal pulmonary artery systolic pressure.  3. Left atrial size was moderately  dilated.  4. Right atrial size was mildly dilated.  5. The mitral valve is normal in structure. Mild to moderate mitral valve regurgitation.  6. The aortic valve is tricuspid. There is moderate calcification of the aortic valve. There is moderate thickening of the aortic valve. Aortic valve regurgitation is not visualized. Mild to moderate aortic valve sclerosis/calcification is present, without any evidence of aortic stenosis. FINDINGS  Left Ventricle: Left ventricular ejection fraction, by estimation, is 60 to 65%. The left ventricle has normal function. The left ventricle has no regional wall motion abnormalities. The left ventricular internal cavity size was normal in size. There is  mild concentric left ventricular hypertrophy. Left ventricular diastolic parameters are consistent with Grade II diastolic dysfunction (pseudonormalization). Elevated left atrial pressure. Right Ventricle: The right ventricular size is normal. No increase in right ventricular wall thickness. Right ventricular systolic function is normal. There is normal pulmonary artery systolic pressure. The tricuspid regurgitant velocity is 2.65 m/s, and  with an assumed right atrial pressure of 3 mmHg, the estimated right ventricular systolic pressure is 31.1 mmHg. Left Atrium: Left atrial size was moderately dilated. Right Atrium: Right atrial size was mildly dilated. Pericardium: There is no evidence of pericardial effusion. Mitral Valve: The mitral valve is normal in structure. Mild to moderate mitral valve regurgitation, with centrally-directed jet. Tricuspid Valve: The tricuspid valve is normal in structure. Tricuspid valve regurgitation is not demonstrated. Aortic Valve: The aortic valve is tricuspid. There is moderate calcification of the aortic valve. There is moderate thickening of the aortic valve. Aortic valve regurgitation is not visualized. Mild to moderate aortic valve sclerosis/calcification is present, without any evidence of  aortic stenosis. Pulmonic Valve: The pulmonic valve was normal in structure. Pulmonic valve regurgitation is not visualized. Aorta: The aortic root is normal in size and structure. IAS/Shunts: No atrial level shunt detected by color flow Doppler.  LEFT VENTRICLE PLAX 2D LVIDd:         4.60 cm  Diastology LVIDs:         3.10 cm  LV e' medial:    9.68 cm/s LV PW:         1.20 cm  LV E/e' medial:  14.4 LV IVS:        1.30 cm  LV e' lateral:   12.50 cm/s LVOT diam:     1.90 cm  LV E/e' lateral: 11.1 LV SV:         54 LV SV Index:   27 LVOT Area:     2.84 cm  RIGHT VENTRICLE             IVC RV Basal diam:  3.30 cm     IVC diam: 1.60 cm  RV Mid diam:    2.40 cm RV S prime:     11.00 cm/s TAPSE (M-mode): 1.9 cm LEFT ATRIUM             Index       RIGHT ATRIUM           Index LA diam:        4.70 cm 2.36 cm/m  RA Area:     20.40 cm LA Vol (A2C):   44.8 ml 22.47 ml/m RA Volume:   56.50 ml  28.34 ml/m LA Vol (A4C):   60.9 ml 30.55 ml/m LA Biplane Vol: 53.8 ml 26.99 ml/m  AORTIC VALVE LVOT Vmax:   86.60 cm/s LVOT Vmean:  59.500 cm/s LVOT VTI:    0.190 m  AORTA Ao Root diam: 3.30 cm Ao Asc diam:  3.15 cm MITRAL VALVE                TRICUSPID VALVE MV Area (PHT): 3.38 cm     TR Peak grad:   28.1 mmHg MV Decel Time: 225 msec     TR Vmax:        265.00 cm/s MV E velocity: 139.00 cm/s MV A velocity: 88.60 cm/s   SHUNTS MV E/A ratio:  1.57         Systemic VTI:  0.19 m                             Systemic Diam: 1.90 cm Mihai Croitoru MD Electronically signed by Thurmon FairMihai Croitoru MD Signature Date/Time: 02/10/2021/3:27:44 PM    Final      Discharge Exam: Vitals:   02/12/21 0344 02/12/21 0733  BP: (!) 109/59 97/69  Pulse: 85 83  Resp: 16 16  Temp: 98.5 F (36.9 C) 98.4 F (36.9 C)  SpO2: 95% 97%   Vitals:   02/11/21 2037 02/11/21 2357 02/12/21 0344 02/12/21 0733  BP: 125/63 110/67 (!) 109/59 97/69  Pulse: 92 90 85 83  Resp: 18 17 16 16   Temp: 98.3 F (36.8 C) 97.9 F (36.6 C) 98.5 F (36.9 C) 98.4 F (36.9 C)   TempSrc: Oral Oral Oral Oral  SpO2: 98%  95% 97%  Weight:      Height:        General: Pt is alert, awake, not in acute distress Cardiovascular: RRR, S1/S2 +, no rubs, no gallops Respiratory: CTA bilaterally, no wheezing, no rhonchi Abdominal: Soft, mild epigastric and moderate left upper quadrant tenderness as well as periumbilical tenderness, ND, bowel sounds + Extremities: no edema, no cyanosis    The results of significant diagnostics from this hospitalization (including imaging, microbiology, ancillary and laboratory) are listed below for reference.     Microbiology: Recent Results (from the past 240 hour(s))  Resp Panel by RT-PCR (Flu A&B, Covid) Nasopharyngeal Swab     Status: None   Collection Time: 02/09/21  1:25 PM   Specimen: Nasopharyngeal Swab; Nasopharyngeal(NP) swabs in vial transport medium  Result Value Ref Range Status   SARS Coronavirus 2 by RT PCR NEGATIVE NEGATIVE Final    Comment: (NOTE) SARS-CoV-2 target nucleic acids are NOT DETECTED.  The SARS-CoV-2 RNA is generally detectable in upper respiratory specimens during the acute phase of infection. The lowest concentration of SARS-CoV-2 viral copies this assay can detect is 138 copies/mL. A negative result does not preclude SARS-Cov-2 infection and should not be used as the sole basis for treatment or other patient management decisions.  A negative result may occur with  improper specimen collection/handling, submission of specimen other than nasopharyngeal swab, presence of viral mutation(s) within the areas targeted by this assay, and inadequate number of viral copies(<138 copies/mL). A negative result must be combined with clinical observations, patient history, and epidemiological information. The expected result is Negative.  Fact Sheet for Patients:  BloggerCourse.com  Fact Sheet for Healthcare Providers:  SeriousBroker.it  This test is no t yet  approved or cleared by the [place] FDA and  has been authorized for detection and/or diagnosis of SARS-CoV-2 by FDA under an Emergency Use Authorization (EUA). This EUA will remain  in effect (meaning this test can be used) for the duration of the COVID-19 declaration under Section 564(b)(1) of the Act, 21 U.S.C.section 360bbb-3(b)(1), unless the authorization is terminated  or revoked sooner.       Influenza A by PCR NEGATIVE NEGATIVE Final   Influenza B by PCR NEGATIVE NEGATIVE Final    Comment: (NOTE) The Xpert Xpress SARS-CoV-2/FLU/RSV plus assay is intended as an aid in the diagnosis of influenza from Nasopharyngeal swab specimens and should not be used as a sole basis for treatment. Nasal washings and aspirates are unacceptable for Xpert Xpress SARS-CoV-2/FLU/RSV testing.  Fact Sheet for Patients: BloggerCourse.com  Fact Sheet for Healthcare Providers: SeriousBroker.it  This test is not yet approved or cleared by the [place] FDA and has been authorized for detection and/or diagnosis of SARS-CoV-2 by FDA under an Emergency Use Authorization (EUA). This EUA will remain in effect (meaning this test can be used) for the duration of the COVID-19 declaration under Section 564(b)(1) of the Act, 21 U.S.C. section 360bbb-3(b)(1), unless the authorization is terminated or revoked.  Performed at [facility], [address] [place], [place] [address]      Labs: BNP (last 3 results) Recent Labs    02/09/21 1315  BNP 70.6   Basic Metabolic Panel: Recent Labs  Lab 02/09/21 1315 02/10/21 0624 02/11/21 0137 02/12/21 0204  NA 133* 138 138 134*  K 4.0 3.9 3.6 3.9  CL 103 105 108 106  CO2 22 25 23 22   GLUCOSE 123* 116* 139* 122*  BUN 14 11 11 12   CREATININE 1.31* 1.38* 1.53* 1.42*  CALCIUM 8.9 9.1 9.1 8.7*   Liver Function Tests: Recent Labs  Lab 02/09/21 1315  AST 21  ALT 10  ALKPHOS 51   BILITOT 0.2*  PROT 7.8  ALBUMIN 3.8   Recent Labs  Lab 02/09/21 1315  LIPASE 41   No results for input(s): AMMONIA in the last 168 hours. CBC: Recent Labs  Lab 02/09/21 1315 02/10/21 0624 02/10/21 0946 02/10/21 1825 02/11/21 0441 02/12/21 0204  WBC 7.0 6.3 5.6 6.6 6.7 8.7  NEUTROABS 4.7  --   --   --   --   --   HGB 7.6* 9.6* 8.9* 9.0* 8.9* 9.2*  HCT 27.3* 32.4* 30.7* 31.2* 30.4* 31.5*  MCV 69.6* 72.2* 72.7* 72.7* 72.9* 73.1*  PLT 339 294 299 312 295 304   Cardiac Enzymes: No results for input(s): CKTOTAL, CKMB, CKMBINDEX, TROPONINI in the last 168 hours. BNP: Invalid input(s): POCBNP CBG: Recent Labs  Lab 02/11/21 1204 02/11/21 1536 02/11/21 2035 02/11/21 2355 02/12/21 0348  GLUCAP 124* 139* 170* 124* 118*   D-Dimer No results for input(s): DDIMER in the last 72 hours. Hgb A1c Recent Labs    02/09/21 2140  HGBA1C 6.4*   Lipid Profile No results for input(s): CHOL, HDL, LDLCALC, TRIG,  CHOLHDL, [employer] in the last 72 hours. Thyroid function studies Recent Labs    02/10/21 1825  TSH 2.880   Anemia work up No results for input(s): VITAMINB12, FOLATE, FERRITIN, TIBC, IRON, RETICCTPCT in the last 72 hours. Urinalysis    Component Value Date/Time   COLORURINE YELLOW 02/09/2021 1720   APPEARANCEUR CLEAR 02/09/2021 1720   LABSPEC <1.005 (L) 02/09/2021 1720   PHURINE 6.0 02/09/2021 1720   GLUCOSEU NEGATIVE 02/09/2021 1720   HGBUR NEGATIVE 02/09/2021 1720   BILIRUBINUR NEGATIVE 02/09/2021 1720   KETONESUR NEGATIVE 02/09/2021 1720   PROTEINUR NEGATIVE 02/09/2021 1720   NITRITE NEGATIVE 02/09/2021 1720   LEUKOCYTESUR NEGATIVE 02/09/2021 1720   Sepsis Labs Invalid input(s): PROCALCITONIN,  WBC,  LACTICIDVEN Microbiology Recent Results (from the past 240 hour(s))  Resp Panel by RT-PCR (Flu A&B, Covid) Nasopharyngeal Swab     Status: None   Collection Time: 02/09/21  1:25 PM   Specimen: Nasopharyngeal Swab; Nasopharyngeal(NP) swabs in vial transport  medium  Result Value Ref Range Status   SARS Coronavirus 2 by RT PCR NEGATIVE NEGATIVE Final    Comment: (NOTE) SARS-CoV-2 target nucleic acids are NOT DETECTED.  The SARS-CoV-2 RNA is generally detectable in upper respiratory specimens during the acute phase of infection. The lowest concentration of SARS-CoV-2 viral copies this assay can detect is 138 copies/mL. A negative result does not preclude SARS-Cov-2 infection and should not be used as the sole basis for treatment or other patient management decisions. A negative result may occur with  improper specimen collection/handling, submission of specimen other than nasopharyngeal swab, presence of viral mutation(s) within the areas targeted by this assay, and inadequate number of viral copies(<138 copies/mL). A negative result must be combined with clinical observations, patient history, and epidemiological information. The expected result is Negative.  Fact Sheet for Patients:  BloggerCourse.com  Fact Sheet for Healthcare Providers:  SeriousBroker.it  This test is no t yet approved or cleared by the [place] FDA and  has been authorized for detection and/or diagnosis of SARS-CoV-2 by FDA under an Emergency Use Authorization (EUA). This EUA will remain  in effect (meaning this test can be used) for the duration of the COVID-19 declaration under Section 564(b)(1) of the Act, 21 U.S.C.section 360bbb-3(b)(1), unless the authorization is terminated  or revoked sooner.       Influenza A by PCR NEGATIVE NEGATIVE Final   Influenza B by PCR NEGATIVE NEGATIVE Final    Comment: (NOTE) The Xpert Xpress SARS-CoV-2/FLU/RSV plus assay is intended as an aid in the diagnosis of influenza from Nasopharyngeal swab specimens and should not be used as a sole basis for treatment. Nasal washings and aspirates are unacceptable for Xpert Xpress SARS-CoV-2/FLU/RSV testing.  Fact Sheet for  Patients: BloggerCourse.com  Fact Sheet for Healthcare Providers: SeriousBroker.it  This test is not yet approved or cleared by the [place] FDA and has been authorized for detection and/or diagnosis of SARS-CoV-2 by FDA under an Emergency Use Authorization (EUA). This EUA will remain in effect (meaning this test can be used) for the duration of the COVID-19 declaration under Section 564(b)(1) of the Act, 21 U.S.C. section 360bbb-3(b)(1), unless the authorization is terminated or revoked.  Performed at [facility], [address] [place], [place] [address]      Time coordinating discharge: Over 30 minutes  SIGNED:   Hughie Closs, MD  [facility] 02/12/2021, 11:14 AM  If 7PM-7AM, please contact night-coverage [email or web address]

## 2021-02-12 NOTE — Progress Notes (Signed)
Patient given discharge instructions, medication list and follow up appointments. IV and tele were dcd. Will discharge home as ordered. Transported to exit via wheel chair and hospital staff. Delaney Schnick, [employer] rn

## 2022-01-11 ENCOUNTER — Emergency Department (HOSPITAL_BASED_OUTPATIENT_CLINIC_OR_DEPARTMENT_OTHER)
Admission: EM | Admit: 2022-01-11 | Discharge: 2022-01-11 | Disposition: A | Discharge status: Home / Self Care | Payer: Medicare HMO | Attending: Emergency Medicine | Admitting: Emergency Medicine

## 2022-01-11 ENCOUNTER — Encounter (HOSPITAL_BASED_OUTPATIENT_CLINIC_OR_DEPARTMENT_OTHER): Payer: Self-pay | Admitting: *Deleted

## 2022-01-11 ENCOUNTER — Other Ambulatory Visit: Payer: Self-pay

## 2022-01-11 ENCOUNTER — Emergency Department (HOSPITAL_BASED_OUTPATIENT_CLINIC_OR_DEPARTMENT_OTHER): Payer: Medicare HMO

## 2022-01-11 DIAGNOSIS — Z7951 Long term (current) use of inhaled steroids: Secondary | ICD-10-CM | POA: Diagnosis not present

## 2022-01-11 DIAGNOSIS — R053 Chronic cough: Secondary | ICD-10-CM | POA: Diagnosis not present

## 2022-01-11 DIAGNOSIS — I509 Heart failure, unspecified: Secondary | ICD-10-CM | POA: Diagnosis not present

## 2022-01-11 DIAGNOSIS — E119 Type 2 diabetes mellitus without complications: Secondary | ICD-10-CM | POA: Diagnosis not present

## 2022-01-11 DIAGNOSIS — R0602 Shortness of breath: Secondary | ICD-10-CM | POA: Diagnosis not present

## 2022-01-11 DIAGNOSIS — J45909 Unspecified asthma, uncomplicated: Secondary | ICD-10-CM | POA: Diagnosis not present

## 2022-01-11 DIAGNOSIS — N189 Chronic kidney disease, unspecified: Secondary | ICD-10-CM | POA: Insufficient documentation

## 2022-01-11 DIAGNOSIS — R112 Nausea with vomiting, unspecified: Secondary | ICD-10-CM | POA: Insufficient documentation

## 2022-01-11 DIAGNOSIS — R0981 Nasal congestion: Secondary | ICD-10-CM | POA: Insufficient documentation

## 2022-01-11 DIAGNOSIS — R059 Cough, unspecified: Secondary | ICD-10-CM | POA: Diagnosis present

## 2022-01-11 DIAGNOSIS — Z20822 Contact with and (suspected) exposure to covid-19: Secondary | ICD-10-CM | POA: Insufficient documentation

## 2022-01-11 LAB — RESP PANEL BY RT-PCR (FLU A&B, COVID) ARPGX2
Influenza A by PCR: NEGATIVE
Influenza B by PCR: NEGATIVE
SARS Coronavirus 2 by RT PCR: NEGATIVE

## 2022-01-11 MED ORDER — DEXAMETHASONE SODIUM PHOSPHATE 10 MG/ML IJ SOLN
10.0000 mg | Freq: Once | INTRAMUSCULAR | Status: AC
  Administered 2022-01-11: 10 mg via INTRAVENOUS
  Filled 2022-01-11: qty 1

## 2022-01-11 MED ORDER — IPRATROPIUM-ALBUTEROL 0.5-2.5 (3) MG/3ML IN SOLN
3.0000 mL | RESPIRATORY_TRACT | Status: AC
  Administered 2022-01-11: 3 mL via RESPIRATORY_TRACT
  Filled 2022-01-11: qty 3

## 2022-01-11 MED ORDER — PREDNISONE 20 MG PO TABS
40.0000 mg | ORAL_TABLET | Freq: Every day | ORAL | 0 refills | Status: AC
Start: 2022-01-12 — End: 2022-01-16

## 2022-01-11 NOTE — ED Provider Notes (Signed)
[facility] EMERGENCY DEPARTMENT Provider Note   CSN: [id number] Arrival date & time: 01/11/22  0737     History  Chief Complaint  Patient presents with   Cough    Autumn Barton is a 76 y.o. female with medical history significant for asthma, diabetes, CHF, CKD, chronic cough, iron-deficiency anemia.  Patient presents to ED complaining of 1 month of cough, congestion, shortness of breath, nausea and vomiting.  On chart review, it appears that the patient has been experiencing this issue since November. Patient states that she has been to see her PCP and was prescribed cough medicine which has not alleviated her symptoms.  Patient continues that she was again seen on 1/27 at [facility] with reassuring work-up.  Patient states that she has attempted to control her symptoms utilizing TheraFlu, Robitussin with mild relief of symptoms.  Patient continues that the symptoms always seem to return.  Patient endorsing a cough, nausea, vomiting, congestion, shortness of breath.  Patient denies chest pain, fevers, abdominal pain, diarrhea, dysuria, wheezing, leg swelling.   Cough Associated symptoms: shortness of breath   Associated symptoms: no chest pain, no fever and no wheezing       Home Medications Prior to Admission medications   Medication Sig Start Date End Date Taking? Authorizing Provider  Finerenone (KERENDIA) 10 MG TABS Take by mouth.   Yes [provider]  isosorbide mononitrate (IMDUR) 30 MG 24 hr tablet Take 30 mg by mouth daily.   Yes [provider]  metoprolol succinate (TOPROL-XL) 25 MG 24 hr tablet Take 25 mg by mouth daily.   Yes [provider]  omeprazole (PRILOSEC) 20 MG capsule Take 20 mg by mouth daily.   Yes [provider]  acetaminophen (TYLENOL) 500 MG tablet Take 1,000 mg by mouth every 6 (six) hours as needed for headache (pain).    [provider]  albuterol (PROAIR HFA) 108 (90 Base) MCG/ACT  inhaler Inhale 2 puffs into the lungs every 4 (four) hours as needed for wheezing or shortness of breath. 09/01/20   Bobbitt, Heywood Iles, MD  amLODipine (NORVASC) 5 MG tablet Take 5 mg by mouth every morning. 07/06/19   [provider]  cyanocobalamin (,VITAMIN B-12,) 1000 MCG/ML injection Inject 1,000 mcg into the skin every 30 (thirty) days. 12/12/20   [provider]  diclofenac Sodium (VOLTAREN) 1 % GEL Apply 2 g topically 4 (four) times daily as needed (pain). 05/23/20   [provider]  ferrous sulfate 325 (65 FE) MG tablet Take 1 tablet (325 mg total) by mouth daily with breakfast. 02/12/21 03/14/21  Hughie Closs, MD  fluticasone (FLONASE) 50 MCG/ACT nasal spray Place 2 sprays into both nostrils daily as needed for allergies or rhinitis. 12/16/20   [provider]  hydrOXYzine (ATARAX/VISTARIL) 10 MG tablet Take 10 mg by mouth 3 (three) times daily as needed for itching. 08/23/20   [provider]  lidocaine (LIDODERM) 5 % Place 1 patch onto the skin daily as needed (pain). 01/26/21   [provider]  montelukast (SINGULAIR) 10 MG tablet Take 10 mg by mouth every morning. 08/04/20   [provider]  pantoprazole (PROTONIX) 40 MG tablet Take 1 tablet (40 mg total) by mouth daily. 02/12/21 03/14/21  Hughie Closs, MD  sitaGLIPtin (JANUVIA) 100 MG tablet Take 100 mg by mouth every morning. 07/21/20   [provider]  SYMBICORT 160-4.5 MCG/ACT inhaler INHALE 2 PUFFS INTO THE LUNGS IN THE MORNING AND AT BEDTIME. USE  WITH SPACER. RINSE, GARGLE AND SPIT OUT AFTER USE. Patient taking differently: Inhale 2 puffs into the lungs 2 (two) times daily. Use with spacer. Rinse, gargle and spit out after use. 11/20/20   Bobbitt, Heywood Iles, MD  Vitamin D, Ergocalciferol, (DRISDOL) 1.25 MG (50000 UT) CAPS capsule Take 50,000 Units by mouth every Sunday. 09/23/16   [provider]  zolpidem (AMBIEN) 5 MG tablet Take 5 mg by mouth at bedtime as  needed for sleep. 08/23/20   [provider]      Allergies    Adhesive [tape], Aspirin, and Ibuprofen    Review of Systems   Review of Systems  Constitutional:  Negative for fever.  HENT:  Positive for congestion.   Respiratory:  Positive for cough and shortness of breath. Negative for wheezing.   Cardiovascular:  Negative for chest pain and leg swelling.  Gastrointestinal:  Positive for nausea and vomiting. Negative for abdominal pain and diarrhea.  Genitourinary:  Negative for dysuria.   Physical Exam Updated Vital Signs BP (!) 142/90 (BP Location: Right Arm)    Pulse 86    Temp 98.9 F (37.2 C) (Oral)    Resp 20    Ht 5\' 4"  (1.626 m)    Wt 92.1 kg    SpO2 96%    BMI 34.84 kg/m  Physical Exam Vitals and nursing note reviewed.  Constitutional:      General: She is not in acute distress.    Appearance: She is not ill-appearing, toxic-appearing or diaphoretic.  HENT:     Head: Normocephalic and atraumatic.     Nose: Nose normal.     Mouth/Throat:     Mouth: Mucous membranes are moist.  Eyes:     Extraocular Movements: Extraocular movements intact.     Conjunctiva/sclera: Conjunctivae normal.     Pupils: Pupils are equal, round, and reactive to light.  Cardiovascular:     Rate and Rhythm: Normal rate and regular rhythm.     Pulses: Normal pulses.     Heart sounds: Normal heart sounds.  Pulmonary:     Effort: Pulmonary effort is normal. No respiratory distress.     Breath sounds: Normal breath sounds. No stridor. No wheezing, rhonchi or rales.  Abdominal:     General: Abdomen is flat. Bowel sounds are normal.     Palpations: Abdomen is soft.     Tenderness: There is no abdominal tenderness.  Musculoskeletal:     Cervical back: Normal range of motion and neck supple. No tenderness.     Right lower leg: No edema.     Left lower leg: No edema.  Skin:    General: Skin is warm and dry.     Capillary Refill: Capillary refill takes less than 2 seconds.   Neurological:     Mental Status: She is alert and oriented to person, place, and time.    ED Results / Procedures / Treatments   Labs (all labs ordered are listed, but only abnormal results are displayed) Labs Reviewed  RESP PANEL BY RT-PCR (FLU A&B, COVID) [id number]    EKG None  Radiology DG Chest Portable 1 View  Result Date: 01/11/2022 CLINICAL DATA:  Cough EXAM: PORTABLE CHEST 1 VIEW COMPARISON:  02/09/2021 FINDINGS: Transverse diameter of heart is increased. There are no signs of pulmonary edema or focal pulmonary consolidation. There is no pleural effusion or pneumothorax. IMPRESSION: Cardiomegaly. There are no signs of pulmonary edema or focal pulmonary consolidation. Electronically Signed   By: 04/11/2021  M.D.   On: 01/11/2022 09:01    Procedures Procedures    Medications Ordered in ED Medications  ipratropium-albuterol (DUONEB) 0.5-2.5 (3) MG/3ML nebulizer solution 3 mL (3 mLs Nebulization Given 01/11/22 0932)  dexamethasone (DECADRON) injection 10 mg (10 mg Intravenous Given 01/11/22 2426)    ED Course/ Medical Decision Making/ A&P                           Medical Decision Making Amount and/or Complexity of Data Reviewed Radiology: ordered.  Risk Prescription drug management.   76 year old female presents to ED for evaluation of cough, nausea, vomiting, congestion and shortness of breath for the last 1 month.  Patient has been seen for this complaint by multiple providers before this visit to include her PCP as well as an urgent care.  On examination today, the patient is afebrile, nontachycardic, nonhypoxic, clear lung sounds bilaterally, soft compressible abdomen.  Patient nontoxic in appearance.  Patient has no appreciable wheezing on examination.  Patient's oxygen saturation remains between 97 and 100% on room air.  Patient has no bilateral pitting edema, she does not appear volume overloaded.  Her abdomen is not distended, it is soft and compressible.  On  my chart review, it appears this patient has received a diagnosis of chronic cough when she visited urgent care on 10/14/21.   Patient chest x-ray interpreted by me shows signs of cardiomegaly however there are no signs of consolidations, effusions, mediastinal widening, atelectasis. Patient respiratory panel shows negative results for all.  At this time, patient treated with 1 round of DuoNeb and 10 mg Decadron injection.  After administration of these medications, the patient states she feels much better.  I have advised the patient to follow-up with her PCP and I will place her on 4 days of steroids in the interim.  Patient and her case discussed with Dr. Stevie Kern who is in agreement with plan.  Patient provided with return precautions and she voiced understanding.  The patient and all of her questions answered to her satisfaction.  Patient stable at this time for discharge.  Final Clinical Impression(s) / ED Diagnoses Final diagnoses:  Chronic cough    Rx / DC Orders ED Discharge Orders     None         Al Decant, PA-C 01/11/22 1015    Milagros Loll, MD 01/13/22 1153

## 2022-01-11 NOTE — Discharge Instructions (Signed)
Return to ED with any new or worsening signs or symptoms such as fevers, chest pain, shortness of breath ?Please follow-up with your PCP for ongoing management of your chronic cough ?Please read through the attached informational brochures concerning chronic cough some adults ?Please pick up the prescription medication I have sent into your pharmacy.  It is 4 days of steroids. ?

## 2022-01-11 NOTE — ED Notes (Signed)
PT has clear BBS (slightly diminished in bases. PT does not appear to be in respiratory distress at this time. Gathered vitals and RN entered into [employer]. ?

## 2022-01-11 NOTE — ED Triage Notes (Signed)
Cough and shortness of breath x 1 month  w n/v ?

## 2022-02-28 ENCOUNTER — Emergency Department (HOSPITAL_BASED_OUTPATIENT_CLINIC_OR_DEPARTMENT_OTHER)
Admission: EM | Admit: 2022-02-28 | Discharge: 2022-02-28 | Disposition: A | Discharge status: Home / Self Care | Payer: Medicare HMO | Attending: Emergency Medicine | Admitting: Emergency Medicine

## 2022-02-28 ENCOUNTER — Encounter (HOSPITAL_BASED_OUTPATIENT_CLINIC_OR_DEPARTMENT_OTHER): Payer: Self-pay | Admitting: Emergency Medicine

## 2022-02-28 ENCOUNTER — Other Ambulatory Visit: Payer: Self-pay

## 2022-02-28 ENCOUNTER — Emergency Department (HOSPITAL_BASED_OUTPATIENT_CLINIC_OR_DEPARTMENT_OTHER): Payer: Medicare HMO

## 2022-02-28 DIAGNOSIS — Z79899 Other long term (current) drug therapy: Secondary | ICD-10-CM | POA: Insufficient documentation

## 2022-02-28 DIAGNOSIS — I509 Heart failure, unspecified: Secondary | ICD-10-CM | POA: Insufficient documentation

## 2022-02-28 DIAGNOSIS — Z7951 Long term (current) use of inhaled steroids: Secondary | ICD-10-CM | POA: Diagnosis not present

## 2022-02-28 DIAGNOSIS — J069 Acute upper respiratory infection, unspecified: Secondary | ICD-10-CM | POA: Insufficient documentation

## 2022-02-28 DIAGNOSIS — R0602 Shortness of breath: Secondary | ICD-10-CM | POA: Diagnosis present

## 2022-02-28 DIAGNOSIS — N189 Chronic kidney disease, unspecified: Secondary | ICD-10-CM | POA: Insufficient documentation

## 2022-02-28 DIAGNOSIS — E1122 Type 2 diabetes mellitus with diabetic chronic kidney disease: Secondary | ICD-10-CM | POA: Diagnosis not present

## 2022-02-28 DIAGNOSIS — J40 Bronchitis, not specified as acute or chronic: Secondary | ICD-10-CM

## 2022-02-28 LAB — BASIC METABOLIC PANEL
Anion gap: 7 (ref 5–15)
BUN: 16 mg/dL (ref 8–23)
CO2: 24 mmol/L (ref 22–32)
Calcium: 9.3 mg/dL (ref 8.9–10.3)
Chloride: 109 mmol/L (ref 98–111)
Creatinine, Ser: 1.3 mg/dL — ABNORMAL HIGH (ref 0.44–1.00)
GFR, Estimated: 42 mL/min — ABNORMAL LOW (ref >60)
Glucose, Bld: 113 mg/dL — ABNORMAL HIGH (ref 70–99)
Potassium: 4.3 mmol/L (ref 3.5–5.1)
Sodium: 140 mmol/L (ref 135–145)

## 2022-02-28 LAB — CBC
HCT: 41.6 % (ref 36.0–46.0)
Hemoglobin: 12.5 g/dL (ref 12.0–15.0)
MCH: 22.9 pg — ABNORMAL LOW (ref 26.0–34.0)
MCHC: 30 g/dL (ref 30.0–36.0)
MCV: 76.3 fL — ABNORMAL LOW (ref 80.0–100.0)
Platelets: 269 10*3/uL (ref 150–400)
RBC: 5.45 MIL/uL — ABNORMAL HIGH (ref 3.87–5.11)
RDW: 15.8 % — ABNORMAL HIGH (ref 11.5–15.5)
WBC: 5.7 10*3/uL (ref 4.0–10.5)
nRBC: 0 % (ref 0.0–0.2)

## 2022-02-28 LAB — TROPONIN I (HIGH SENSITIVITY)
Troponin I (High Sensitivity): 8 ng/L (ref <18)
Troponin I (High Sensitivity): 8 ng/L (ref <18)

## 2022-02-28 MED ORDER — DEXAMETHASONE SODIUM PHOSPHATE 10 MG/ML IJ SOLN
10.0000 mg | Freq: Once | INTRAMUSCULAR | Status: AC
  Administered 2022-02-28: 10 mg via INTRAMUSCULAR
  Filled 2022-02-28: qty 1

## 2022-02-28 MED ORDER — ALBUTEROL SULFATE HFA 108 (90 BASE) MCG/ACT IN AERS
2.0000 | INHALATION_SPRAY | Freq: Once | RESPIRATORY_TRACT | Status: AC
Start: 2022-02-28 — End: 2022-02-28
  Administered 2022-02-28: 2 via RESPIRATORY_TRACT
  Filled 2022-02-28: qty 6.7

## 2022-02-28 NOTE — ED Triage Notes (Signed)
Pt arrives pov, slow gait, c/o cough,  shob and CP for 1 week. Endorse using inhaler with no relief ?

## 2022-02-28 NOTE — ED Notes (Signed)
Dr. Johnney Killian to bedside. ?

## 2022-02-28 NOTE — ED Notes (Signed)
Patient having repeated complaints of not having seen ED provider yet.  Charge nurse Dionne Milo, RN notified verbally.  Patient reassured provider would be in as soon as they can.  ?

## 2022-02-28 NOTE — ED Notes (Signed)
Ambulated from lobby to triage, Spo2 99% on r/a, RR 34, BBS decreased. ?

## 2022-02-28 NOTE — ED Provider Notes (Signed)
?[facility] EMERGENCY DEPARTMENT ?Provider Note ? ? ?CSN: [id number] ?Arrival date & time: 02/28/22  1728 ? ?  ? ?History ? ?Chief Complaint  ?Patient presents with  ? Shortness of Breath  ? ? ?Autumn Barton is a 77 y.o. female. ? ?HPI ?Autumn Barton is a 76 y.o. female with medical history significant for asthma, diabetes, CHF, CKD, chronic cough, iron-deficiency anemia.  Patient reports that she has gotten a lot of coughing.  She always feels like she has some congestion or drainage in her throat.  She has also had nasal congestion and some sore throat.  Patient reports this made her feel very short of breath.  Patient's daughter reports that this is recurrent every several months.  The patient has had treatment with steroids and inhalers and seen improvement previously.  Patient's daughter reports that they have had extensive diagnostic work-up including specialty referrals to ENT but no specific diagnosis and symptoms do continue to recur.  Patient's daughter reports that they do try to follow all the recommendations for taking reflux medications and allergy medications. ?  ? ?Home Medications ?Prior to Admission medications   ?Medication Sig Start Date End Date Taking? Authorizing Provider  ?acetaminophen (TYLENOL) 500 MG tablet Take 1,000 mg by mouth every 6 (six) hours as needed for headache (pain).    [provider]  ?albuterol (PROAIR HFA) 108 (90 Base) MCG/ACT inhaler Inhale 2 puffs into the lungs every 4 (four) hours as needed for wheezing or shortness of breath. 09/01/20   Bobbitt, Sedalia Muta, MD  ?amLODipine (NORVASC) 5 MG tablet Take 5 mg by mouth every morning. 07/06/19   [provider]  ?cyanocobalamin (,VITAMIN B-12,) 1000 MCG/ML injection Inject 1,000 mcg into the skin every 30 (thirty) days. 12/12/20   [provider]  ?diclofenac Sodium (VOLTAREN) 1 % GEL Apply 2 g topically 4 (four) times daily as needed (pain). 05/23/20   [provider]   ?ferrous sulfate 325 (65 FE) MG tablet Take 1 tablet (325 mg total) by mouth daily with breakfast. 02/12/21 03/14/21  Darliss Cheney, MD  ?Finerenone (KERENDIA) 10 MG TABS Take by mouth.    [provider]  ?fluticasone (FLONASE) 50 MCG/ACT nasal spray Place 2 sprays into both nostrils daily as needed for allergies or rhinitis. 12/16/20   [provider]  ?hydrOXYzine (ATARAX/VISTARIL) 10 MG tablet Take 10 mg by mouth 3 (three) times daily as needed for itching. 08/23/20   [provider]  ?isosorbide mononitrate (IMDUR) 30 MG 24 hr tablet Take 30 mg by mouth daily.    [provider]  ?lidocaine (LIDODERM) 5 % Place 1 patch onto the skin daily as needed (pain). 01/26/21   [provider]  ?metoprolol succinate (TOPROL-XL) 25 MG 24 hr tablet Take 25 mg by mouth daily.    [provider]  ?montelukast (SINGULAIR) 10 MG tablet Take 10 mg by mouth every morning. 08/04/20   [provider]  ?omeprazole (PRILOSEC) 20 MG capsule Take 20 mg by mouth daily.    [provider]  ?pantoprazole (PROTONIX) 40 MG tablet Take 1 tablet (40 mg total) by mouth daily. 02/12/21 03/14/21  Darliss Cheney, MD  ?sitaGLIPtin (JANUVIA) 100 MG tablet Take 100 mg by mouth every morning. 07/21/20   [provider]  ?SYMBICORT 160-4.5 MCG/ACT inhaler INHALE 2 PUFFS INTO THE LUNGS IN THE MORNING AND AT BEDTIME. USE WITH SPACER. RINSE, GARGLE AND SPIT OUT AFTER USE. ?Patient taking differently: Inhale 2 puffs into the lungs 2 (  two) times daily. Use with spacer. Rinse, gargle and spit out after use. 11/20/20   Bobbitt, Sedalia Muta, MD  ?Vitamin D, Ergocalciferol, (DRISDOL) 1.25 MG (50000 UT) CAPS capsule Take 50,000 Units by mouth every Sunday. 09/23/16   [provider]  ?zolpidem (AMBIEN) 5 MG tablet Take 5 mg by mouth at bedtime as needed for sleep. 08/23/20   [provider]  ?   ? ?Allergies    ?Adhesive [tape], Aspirin, and Ibuprofen   ? ?Review of Systems    ?Review of Systems ?10 systems reviewed negative except as per HPI ?Physical Exam ?Updated Vital Signs ?BP 131/84   Pulse 96   Temp 98.3 ?F (36.8 ?C) (Oral)   Resp 18   Ht 5\' 4"  (1.626 m)   Wt 92.5 kg   SpO2 97%   BMI 35.02 kg/m?  ?Physical Exam ?Constitutional:   ?   Appearance: Normal appearance.  ?HENT:  ?   Nose: Nose normal.  ?   Mouth/Throat:  ?   Mouth: Mucous membranes are moist.  ?   Pharynx: Oropharynx is clear.  ?Eyes:  ?   Extraocular Movements: Extraocular movements intact.  ?   Conjunctiva/sclera: Conjunctivae normal.  ?Cardiovascular:  ?   Rate and Rhythm: Normal rate and regular rhythm.  ?Pulmonary:  ?   Effort: Pulmonary effort is normal.  ?   Breath sounds: Normal breath sounds.  ?Abdominal:  ?   General: There is no distension.  ?   Palpations: Abdomen is soft.  ?   Tenderness: There is no abdominal tenderness. There is no guarding.  ?Musculoskeletal:     ?   General: No swelling.  ?   Right lower leg: No edema.  ?   Left lower leg: No edema.  ?Skin: ?   General: Skin is warm and dry.  ?Neurological:  ?   General: No focal deficit present.  ?   Mental Status: She is alert and oriented to person, place, and time.  ?   Motor: No weakness.  ?Psychiatric:     ?   Mood and Affect: Mood normal.  ? ? ?ED Results / Procedures / Treatments   ?Labs ?(all labs ordered are listed, but only abnormal results are displayed) ?Labs Reviewed  ?BASIC METABOLIC PANEL - Abnormal; Notable for the following components:  ?    Result Value  ? Glucose, Bld 113 (*)   ? Creatinine, Ser 1.30 (*)   ? GFR, Estimated 42 (*)   ? All other components within normal limits  ?CBC - Abnormal; Notable for the following components:  ? RBC 5.45 (*)   ? MCV 76.3 (*)   ? MCH 22.9 (*)   ? RDW 15.8 (*)   ? All other components within normal limits  ?TROPONIN I (HIGH SENSITIVITY)  ?TROPONIN I (HIGH SENSITIVITY)  ? ? ?EKG ?EKG Interpretation ? ?Date/Time:  Sunday February 28 2022 17:49:20 EDT ?Ventricular Rate:  78 ?PR Interval:  376 ?QRS  Duration: 124 ?QT Interval:  504 ?QTC Calculation: 574 ?R Axis:   -33 ?Text Interpretation: Sinus rhythm with 1st degree A-V block with occasional Premature ventricular complexes old RBBB., inferior axis change compared to previous When compared with ECG of 09-Feb-2021 12:36, PREVIOUS ECG IS PRESENT Confirmed by Charlesetta Shanks [phone]) on 02/28/2022 9:27:47 PM ? ?Radiology ?DG Chest 2 View ? ?Result Date: 02/28/2022 ?CLINICAL DATA:  Shortness of breath.  Cough.  Chest pain. EXAM: CHEST - 2 VIEW COMPARISON:  AP chest 01/11/2022 FINDINGS: Cardiac silhouette  is again mildly enlarged. Mediastinal contours are within normal limits with calcification seen within aortic arch. The lungs are clear. No pleural effusion or pneumothorax. Mild multilevel degenerative disc changes of the thoracic spine. IMPRESSION: Cardiomegaly.  No active cardiopulmonary disease. Electronically Signed   By: Yvonne Kendall M.D.   On: 02/28/2022 18:23   ? ?Procedures ?Procedures  ? ? ?Medications Ordered in ED ?Medications  ?dexamethasone (DECADRON) injection 10 mg (10 mg Intramuscular Given 02/28/22 2331)  ?albuterol (VENTOLIN HFA) 108 (90 Base) MCG/ACT inhaler 2 puff (2 puffs Inhalation Given 02/28/22 2330)  ? ? ?ED Course/ Medical Decision Making/ A&P ?  ?                        ?Medical Decision Making ?Amount and/or Complexity of Data Reviewed ?Labs: ordered. ?Radiology: ordered. ? ?Risk ?Prescription drug management. ? ? ?Patient presents with accommodation of cough and upper respiratory symptoms.  Patient's age and medical comorbidities consideration also given to pneumonia\COPD\CHF\ACS. ? ?Patient does not however have significant wheezing and does not have respiratory distress at rest. ? ?Diagnostic work-up is stable.  Vital signs are stable ? ?Patient has previously gotten improvement with steroid therapy and inhalers.  Patient given Decadron in the emergency department.  An albuterol inhaler is dispensed for home use. ? ?At this time patient  is appropriate for discharge.  Her daughter is with her.  She does not have acute distress.  Recommendations are for close follow-up and recheck. ? ? ? ? ? ? ? ? ? ?Final Clinical Impression(s) / ED Diagnoses ?Roney Mans

## 2022-02-28 NOTE — ED Notes (Signed)
Patient discharged to home.  All discharge instructions reviewed.  Patient verbalized understanding via teachback method.  VS WDL.  Respirations even and unlabored.  Ambulatory out of ED.   °

## 2022-02-28 NOTE — Discharge Instructions (Signed)
1.  You were given a dose of Decadron in the emergency department.  This is a steroid that should work for the next several days. ?2.  You are given an albuterol inhaler.  Use 2 puffs every 4 hours as needed for wheezing or coughing. ?3.  Return to emergency department if you have worsening shortness of breath, chest pain or other concerning symptoms. ?4.  Schedule appointment with your doctor soon as possible for recheck. ?

## 2022-04-07 ENCOUNTER — Encounter (HOSPITAL_BASED_OUTPATIENT_CLINIC_OR_DEPARTMENT_OTHER): Payer: Self-pay

## 2022-04-07 ENCOUNTER — Emergency Department (HOSPITAL_BASED_OUTPATIENT_CLINIC_OR_DEPARTMENT_OTHER): Payer: Medicare HMO

## 2022-04-07 ENCOUNTER — Emergency Department (HOSPITAL_BASED_OUTPATIENT_CLINIC_OR_DEPARTMENT_OTHER)
Admission: EM | Admit: 2022-04-07 | Discharge: 2022-04-07 | Disposition: A | Discharge status: Home / Self Care | Payer: Medicare HMO | Attending: Emergency Medicine | Admitting: Emergency Medicine

## 2022-04-07 ENCOUNTER — Other Ambulatory Visit: Payer: Self-pay

## 2022-04-07 DIAGNOSIS — Z20822 Contact with and (suspected) exposure to covid-19: Secondary | ICD-10-CM | POA: Diagnosis not present

## 2022-04-07 DIAGNOSIS — J4521 Mild intermittent asthma with (acute) exacerbation: Secondary | ICD-10-CM

## 2022-04-07 DIAGNOSIS — Z7951 Long term (current) use of inhaled steroids: Secondary | ICD-10-CM | POA: Insufficient documentation

## 2022-04-07 DIAGNOSIS — J45909 Unspecified asthma, uncomplicated: Secondary | ICD-10-CM | POA: Diagnosis not present

## 2022-04-07 DIAGNOSIS — N189 Chronic kidney disease, unspecified: Secondary | ICD-10-CM | POA: Diagnosis not present

## 2022-04-07 DIAGNOSIS — E1122 Type 2 diabetes mellitus with diabetic chronic kidney disease: Secondary | ICD-10-CM | POA: Insufficient documentation

## 2022-04-07 DIAGNOSIS — Z79899 Other long term (current) drug therapy: Secondary | ICD-10-CM | POA: Insufficient documentation

## 2022-04-07 DIAGNOSIS — I130 Hypertensive heart and chronic kidney disease with heart failure and stage 1 through stage 4 chronic kidney disease, or unspecified chronic kidney disease: Secondary | ICD-10-CM | POA: Diagnosis not present

## 2022-04-07 DIAGNOSIS — I509 Heart failure, unspecified: Secondary | ICD-10-CM | POA: Insufficient documentation

## 2022-04-07 DIAGNOSIS — R059 Cough, unspecified: Secondary | ICD-10-CM | POA: Diagnosis present

## 2022-04-07 LAB — CBC WITH DIFFERENTIAL/PLATELET
Abs Immature Granulocytes: 0.01 10*3/uL (ref 0.00–0.07)
Basophils Absolute: 0 10*3/uL (ref 0.0–0.1)
Basophils Relative: 0 %
Eosinophils Absolute: 0.1 10*3/uL (ref 0.0–0.5)
Eosinophils Relative: 2 %
HCT: 41.7 % (ref 36.0–46.0)
Hemoglobin: 12.5 g/dL (ref 12.0–15.0)
Immature Granulocytes: 0 %
Lymphocytes Relative: 39 %
Lymphs Abs: 1.6 10*3/uL (ref 0.7–4.0)
MCH: 22.5 pg — ABNORMAL LOW (ref 26.0–34.0)
MCHC: 30 g/dL (ref 30.0–36.0)
MCV: 75 fL — ABNORMAL LOW (ref 80.0–100.0)
Monocytes Absolute: 0.5 10*3/uL (ref 0.1–1.0)
Monocytes Relative: 11 %
Neutro Abs: 2 10*3/uL (ref 1.7–7.7)
Neutrophils Relative %: 48 %
Platelets: 242 10*3/uL (ref 150–400)
RBC: 5.56 MIL/uL — ABNORMAL HIGH (ref 3.87–5.11)
RDW: 14.6 % (ref 11.5–15.5)
WBC: 4.2 10*3/uL (ref 4.0–10.5)
nRBC: 0 % (ref 0.0–0.2)

## 2022-04-07 LAB — BASIC METABOLIC PANEL WITH GFR
Anion gap: 8 (ref 5–15)
BUN: 12 mg/dL (ref 8–23)
CO2: 24 mmol/L (ref 22–32)
Calcium: 9 mg/dL (ref 8.9–10.3)
Chloride: 107 mmol/L (ref 98–111)
Creatinine, Ser: 1.37 mg/dL — ABNORMAL HIGH (ref 0.44–1.00)
GFR, Estimated: 40 mL/min — ABNORMAL LOW
Glucose, Bld: 102 mg/dL — ABNORMAL HIGH (ref 70–99)
Potassium: 3.7 mmol/L (ref 3.5–5.1)
Sodium: 139 mmol/L (ref 135–145)

## 2022-04-07 LAB — RESP PANEL BY RT-PCR (FLU A&B, COVID) ARPGX2
Influenza A by PCR: NEGATIVE
Influenza B by PCR: NEGATIVE
SARS Coronavirus 2 by RT PCR: NEGATIVE

## 2022-04-07 MED ORDER — ALBUTEROL SULFATE HFA 108 (90 BASE) MCG/ACT IN AERS
1.0000 | INHALATION_SPRAY | RESPIRATORY_TRACT | Status: DC | PRN
  Administered 2022-04-07: 2 via RESPIRATORY_TRACT
  Filled 2022-04-07: qty 6.7

## 2022-04-07 MED ORDER — PREDNISONE 50 MG PO TABS: 50.0000 mg | ORAL_TABLET | Freq: Every day | ORAL | 0 refills | Status: AC

## 2022-04-07 MED ORDER — IPRATROPIUM-ALBUTEROL 0.5-2.5 (3) MG/3ML IN SOLN
3.0000 mL | Freq: Once | RESPIRATORY_TRACT | Status: AC
  Administered 2022-04-07: 3 mL via RESPIRATORY_TRACT
  Filled 2022-04-07: qty 3

## 2022-04-07 MED ORDER — AEROCHAMBER Z-STAT PLUS/MEDIUM MISC
1.0000 | Freq: Once | Status: AC
  Administered 2022-04-07: 1
  Filled 2022-04-07: qty 1

## 2022-04-07 MED ORDER — DEXAMETHASONE SODIUM PHOSPHATE 10 MG/ML IJ SOLN
10.0000 mg | Freq: Once | INTRAMUSCULAR | Status: AC
  Administered 2022-04-07: 10 mg via INTRAVENOUS
  Filled 2022-04-07: qty 1

## 2022-04-07 NOTE — Discharge Instructions (Addendum)
The steroid given today (Decadron) and the oral steroids (prednisone) may increase your blood sugar.  Please pay close attention to your sugars and try to eat foods low in carbohydrates.

## 2022-04-07 NOTE — ED Triage Notes (Addendum)
C/o dry cough & sneezing since Thursday. States sometimes gets short of breath. Hx of asthma, been taking inhaler, last used this morning.

## 2022-04-07 NOTE — ED Provider Notes (Signed)
[facility]ERGENCY DEPARTMENT Provider Note   CSN: [id number] Arrival date & time: 04/07/22  [id number]     History  Chief Complaint  Patient presents with   Cough    Autumn Barton is a 76 y.o. female.  Pt is a 76 yo female with a pmhx significant for CKD, HTN, CHF, DM2, hyperlipidemia, DJD, PUD, PAD, vitamin D and B12 def, gout, GERD, OSA, chronic pain, Hep C, and asthma.  Pt has had a dry cough and sneezing since Thursday, 5/25.  Pt has been using her inhaler quite a bit and is almost out.  She requests a referral to pulmonology as she has frequent flare ups.  Pt was here on 4/23 for the same.  She said the steroid helped her and did not increase her blood sugar too much.       Home Medications Prior to Admission medications   Medication Sig Start Date End Date Taking? Authorizing Provider  predniSONE (DELTASONE) 50 MG tablet Take 1 tablet (50 mg total) by mouth daily with breakfast. 04/07/22  Yes Isla Pence, MD  acetaminophen (TYLENOL) 500 MG tablet Take 1,000 mg by mouth every 6 (six) hours as needed for headache (pain).    [provider]  albuterol (PROAIR HFA) 108 (90 Base) MCG/ACT inhaler Inhale 2 puffs into the lungs every 4 (four) hours as needed for wheezing or shortness of breath. 09/01/20   Bobbitt, [place] Muta, MD  amLODipine (NORVASC) 5 MG tablet Take 5 mg by mouth every morning. 07/06/19   [provider]  cyanocobalamin (,VITAMIN B-12,) 1000 MCG/ML injection Inject 1,000 mcg into the skin every 30 (thirty) days. 12/12/20   [provider]  diclofenac Sodium (VOLTAREN) 1 % GEL Apply 2 g topically 4 (four) times daily as needed (pain). 05/23/20   [provider]  ferrous sulfate 325 (65 FE) MG tablet Take 1 tablet (325 mg total) by mouth daily with breakfast. 02/12/21 03/14/21  Darliss Cheney, MD  Finerenone (KERENDIA) 10 MG TABS Take by mouth.    [provider]  fluticasone (FLONASE) 50 MCG/ACT nasal spray Place 2  sprays into both nostrils daily as needed for allergies or rhinitis. 12/16/20   [provider]  hydrOXYzine (ATARAX/VISTARIL) 10 MG tablet Take 10 mg by mouth 3 (three) times daily as needed for itching. 08/23/20   [provider]  isosorbide mononitrate (IMDUR) 30 MG 24 hr tablet Take 30 mg by mouth daily.    [provider]  lidocaine (LIDODERM) 5 % Place 1 patch onto the skin daily as needed (pain). 01/26/21   [provider]  metoprolol succinate (TOPROL-XL) 25 MG 24 hr tablet Take 25 mg by mouth daily.    [provider]  montelukast (SINGULAIR) 10 MG tablet Take 10 mg by mouth every morning. 08/04/20   [provider]  omeprazole (PRILOSEC) 20 MG capsule Take 20 mg by mouth daily.    [provider]  pantoprazole (PROTONIX) 40 MG tablet Take 1 tablet (40 mg total) by mouth daily. 02/12/21 03/14/21  Darliss Cheney, MD  sitaGLIPtin (JANUVIA) 100 MG tablet Take 100 mg by mouth every morning. 07/21/20   [provider]  SYMBICORT 160-4.5 MCG/ACT inhaler INHALE 2 PUFFS INTO THE LUNGS IN THE MORNING AND AT BEDTIME. USE WITH SPACER. RINSE, GARGLE AND SPIT OUT AFTER USE. Patient taking differently: Inhale 2 puffs into the lungs 2 (two) times daily. Use with spacer. Rinse, gargle and spit out after use. 11/20/20   Golda Acre  Eulas Post, MD  Vitamin D, Ergocalciferol, (DRISDOL) 1.25 MG (50000 UT) CAPS capsule Take 50,000 Units by mouth every Sunday. 09/23/16   [provider]  zolpidem (AMBIEN) 5 MG tablet Take 5 mg by mouth at bedtime as needed for sleep. 08/23/20   [provider]      Allergies    Adhesive [tape], Aspirin, and Ibuprofen    Review of Systems   Review of Systems  HENT:  Positive for congestion.   Respiratory:  Positive for cough, shortness of breath and wheezing.   All other systems reviewed and are negative.  Physical Exam Updated Vital Signs BP (!) 126/91 (BP Location: Right Arm)   Pulse 92    Temp 98.2 F (36.8 C) (Oral)   Resp 20   Ht 5\' 2"  (1.575 m)   Wt 91.6 kg   SpO2 96%   BMI 36.95 kg/m  Physical Exam Vitals and nursing note reviewed.  Constitutional:      Appearance: Normal appearance.  HENT:     Head: Normocephalic and atraumatic.     Right Ear: External ear normal.     Left Ear: External ear normal.     Nose: Nose normal.     Mouth/Throat:     Mouth: Mucous membranes are moist.     Pharynx: Oropharynx is clear.  Eyes:     Extraocular Movements: Extraocular movements intact.     Conjunctiva/sclera: Conjunctivae normal.     Pupils: Pupils are equal, round, and reactive to light.  Cardiovascular:     Rate and Rhythm: Normal rate and regular rhythm.     Pulses: Normal pulses.     Heart sounds: Normal heart sounds.  Pulmonary:     Effort: Pulmonary effort is normal.     Breath sounds: Wheezing present.  Abdominal:     General: Abdomen is flat. Bowel sounds are normal.     Palpations: Abdomen is soft.  Musculoskeletal:        General: Normal range of motion.     Cervical back: Normal range of motion and neck supple.  Skin:    General: Skin is warm.     Capillary Refill: Capillary refill takes less than 2 seconds.  Neurological:     General: No focal deficit present.     Mental Status: She is alert and oriented to person, place, and time.  Psychiatric:        Mood and Affect: Mood normal.        Behavior: Behavior normal.    ED Results / Procedures / Treatments   Labs (all labs ordered are listed, but only abnormal results are displayed) Labs Reviewed  BASIC METABOLIC PANEL - Abnormal; Notable for the following components:      Result Value   Glucose, Bld 102 (*)    Creatinine, Ser 1.37 (*)    GFR, Estimated 40 (*)    All other components within normal limits  CBC WITH DIFFERENTIAL/PLATELET - Abnormal; Notable for the following components:   RBC 5.56 (*)    MCV 75.0 (*)    MCH 22.5 (*)    All other components within normal limits  RESP PANEL  BY RT-PCR (FLU A&B, COVID) [id number]    EKG EKG Interpretation  Date/Time:  Wednesday Apr 07 2022 08:02:57 EDT Ventricular Rate:  84 PR Interval:  200 QRS Duration: 138 QT Interval:  422 QTC Calculation: 499 R Axis:   -32 Text Interpretation: Sinus rhythm 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 EXAM: CHEST - 2 VIEW COMPARISON:  Radiographs 02/28/2022.  CT 11/12/2020. FINDINGS: The heart size and mediastinal contours are stable with mild cardiomegaly and aortic atherosclerosis. The lungs are clear. There is no pleural effusion or pneumothorax. Stable mild degenerative changes in the spine. Telemetry leads overlie the chest. IMPRESSION: Stable chest.  No active cardiopulmonary process. Electronically Signed   By: Richardean Sale M.D.   On: 04/07/2022 09:02    Procedures Procedures    Medications Ordered in ED Medications  albuterol (VENTOLIN HFA) 108 (90 Base) MCG/ACT inhaler 1-2 puff (2 puffs Inhalation Given 04/07/22 0826)  dexamethasone (DECADRON) injection 10 mg (10 mg Intravenous Given 04/07/22 0809)  ipratropium-albuterol (DUONEB) 0.5-2.5 (3) MG/3ML nebulizer solution 3 mL (3 mLs Nebulization Given 04/07/22 0826)  aerochamber Z-Stat Plus/medium 1 each (1 each Other Given 04/07/22 [id number])    ED Course/ Medical Decision Making/ A&P                           Medical Decision Making Amount and/or Complexity of Data Reviewed Labs: ordered. Radiology: ordered.  Risk Prescription drug management.   This patient presents to the ED for concern of cough, this involves an extensive number of treatment options, and is a complaint that carries with it a high risk of complications and morbidity.  The differential diagnosis includes asthma exac, pna, uri, covid/flu   Co morbidities that  complicate the patient evaluation   CKD, HTN, CHF, DM2, hyperlipidemia, DJD, PUD, PAD, vitamin D and B12 def, gout, GERD, OSA, chronic pain, Hep C, and asthma   Additional history obtained:  Additional history obtained from epic chart review  Lab Tests:  I Ordered, and personally interpreted labs.  The pertinent results include:  cbc nl; bmp nl, covid/flu neg   Imaging Studies ordered:  I ordered imaging studies including cxr  I independently visualized and interpreted imaging which showed    IMPRESSION:  Stable chest.  No active cardiopulmonary process.   I agree with the radiologist interpretation   Cardiac Monitoring:  The patient was maintained on a cardiac monitor.  I personally viewed and interpreted the cardiac monitored which showed an underlying rhythm of: nsr   Medicines ordered and prescription drug management:  I ordered medication including decadron and nebs  for asthma  Reevaluation of the patient after these medicines showed that the patient improved I have reviewed the patients home medicines and have made adjustments as needed  Critical Interventions:  Nebs/steroids  Problem List / ED Course:  Asthma exacerbation:  pt is feeling much better.  She is nearly out of her albuterol inhaler, so she is given one here.  She requested a referral to pulm, so I did give her one.  Pt is d/c with 5 days of prednisone.  She is encouraged to f/u with pcp and to return if worse.    Reevaluation:  After the interventions noted above, I reevaluated the patient and found that they have :improved   Social Determinants of Health:  Lives at home alone   Dispostion:  After consideration of the diagnostic results and the patients response to treatment, I feel that the patent would benefit from discharge with outpatient f/u.          Final Clinical Impression(s) / ED  Diagnoses Final diagnoses:  Mild intermittent asthma with exacerbation    Rx / DC  Orders ED Discharge Orders          Ordered    predniSONE (DELTASONE) 50 MG tablet  Daily with breakfast        04/07/22 0914    Ambulatory referral to Pulmonology        04/07/22 [id number]              Isla Pence, MD 04/07/22 [phone]

## 2022-06-16 ENCOUNTER — Institutional Professional Consult (permissible substitution): Payer: Medicare HMO | Admitting: Internal Medicine

## 2022-12-07 IMAGING — CR DG CHEST 2V
2 series · 2 of 2 positions shown · non-contrast
Comparison: Radiographs 02/28/2022.  CT 11/12/2020.

CLINICAL DATA: Shortness of breath with dry cough and sneezing for
6 days.

EXAM:
CHEST - 2 VIEW

[w chest pa]
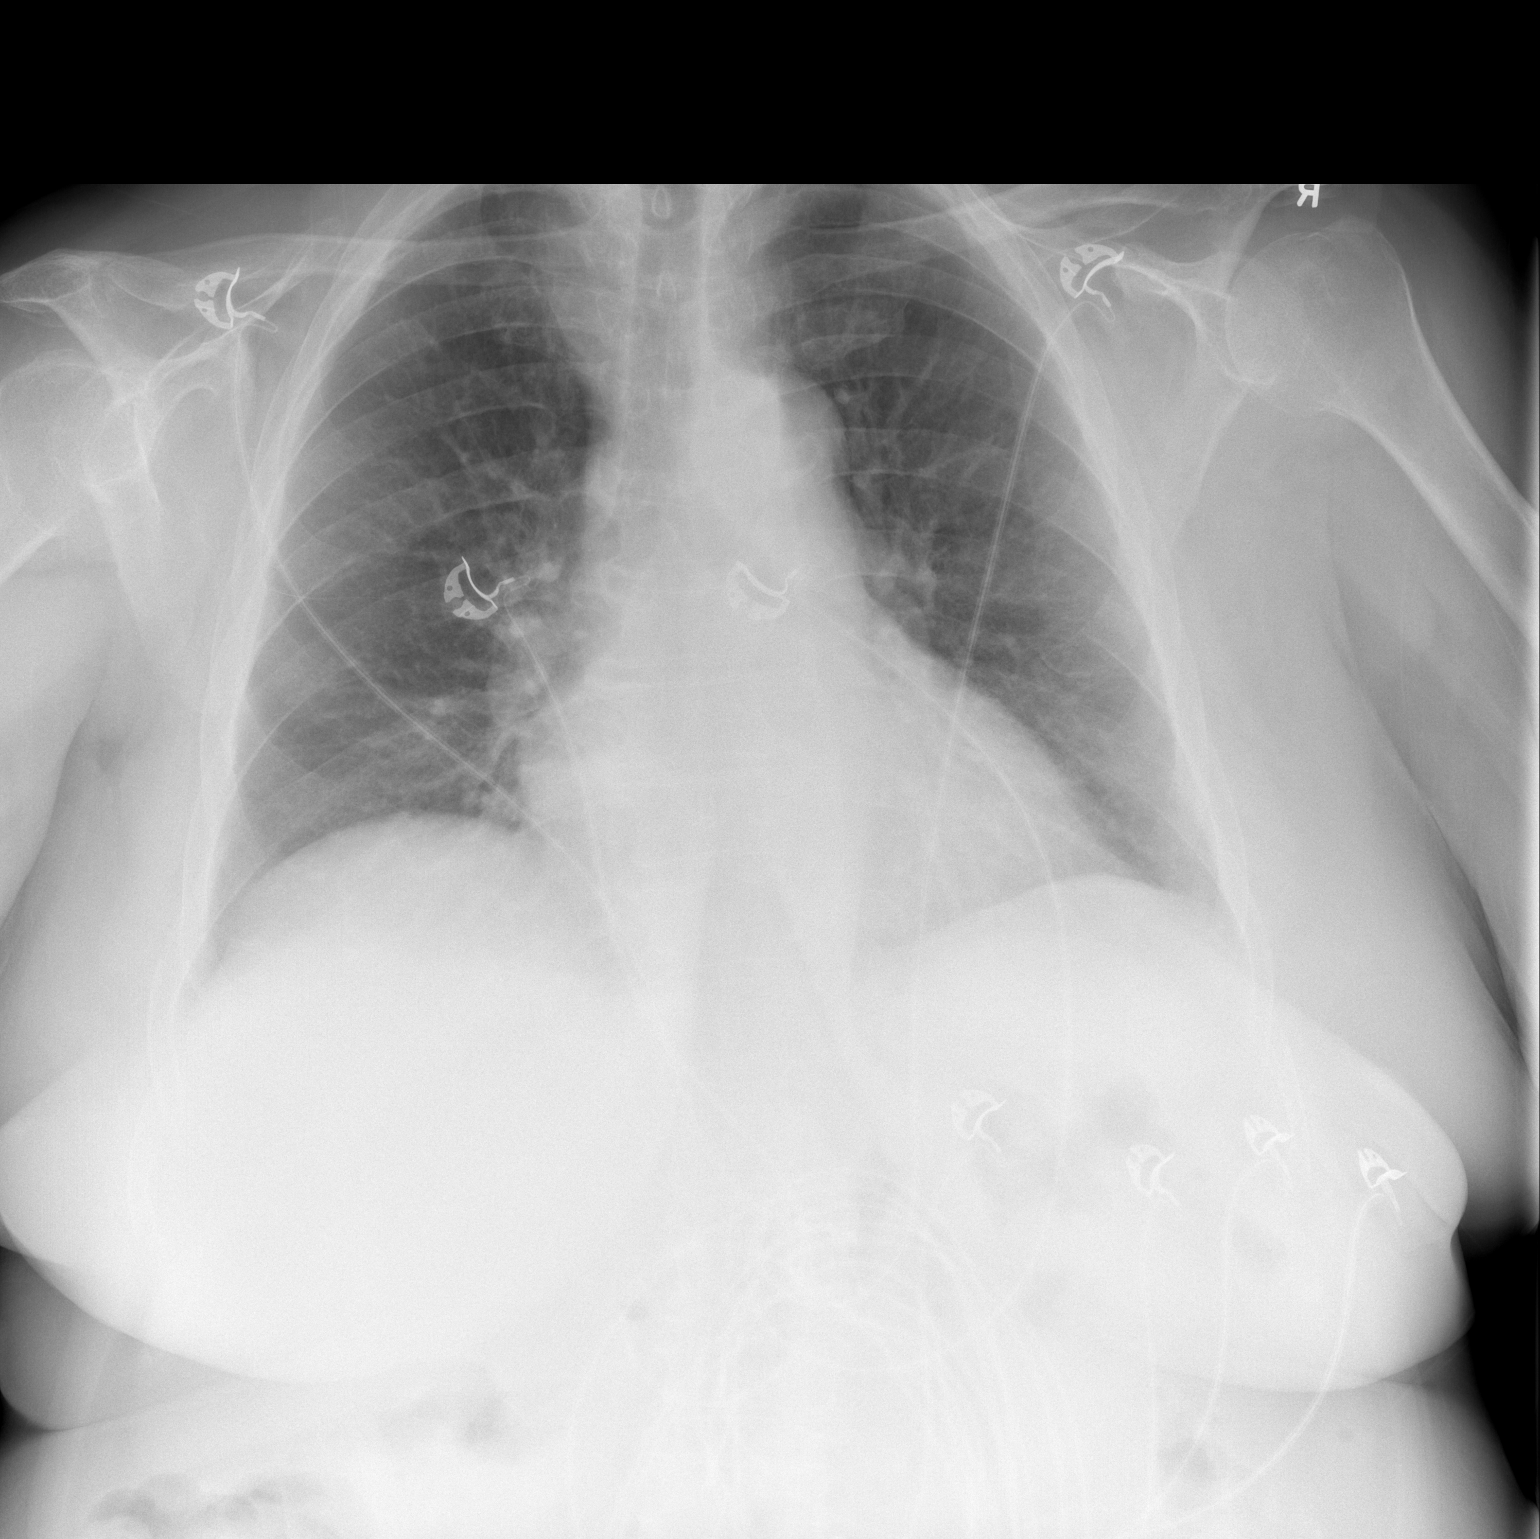

[w chest lat]
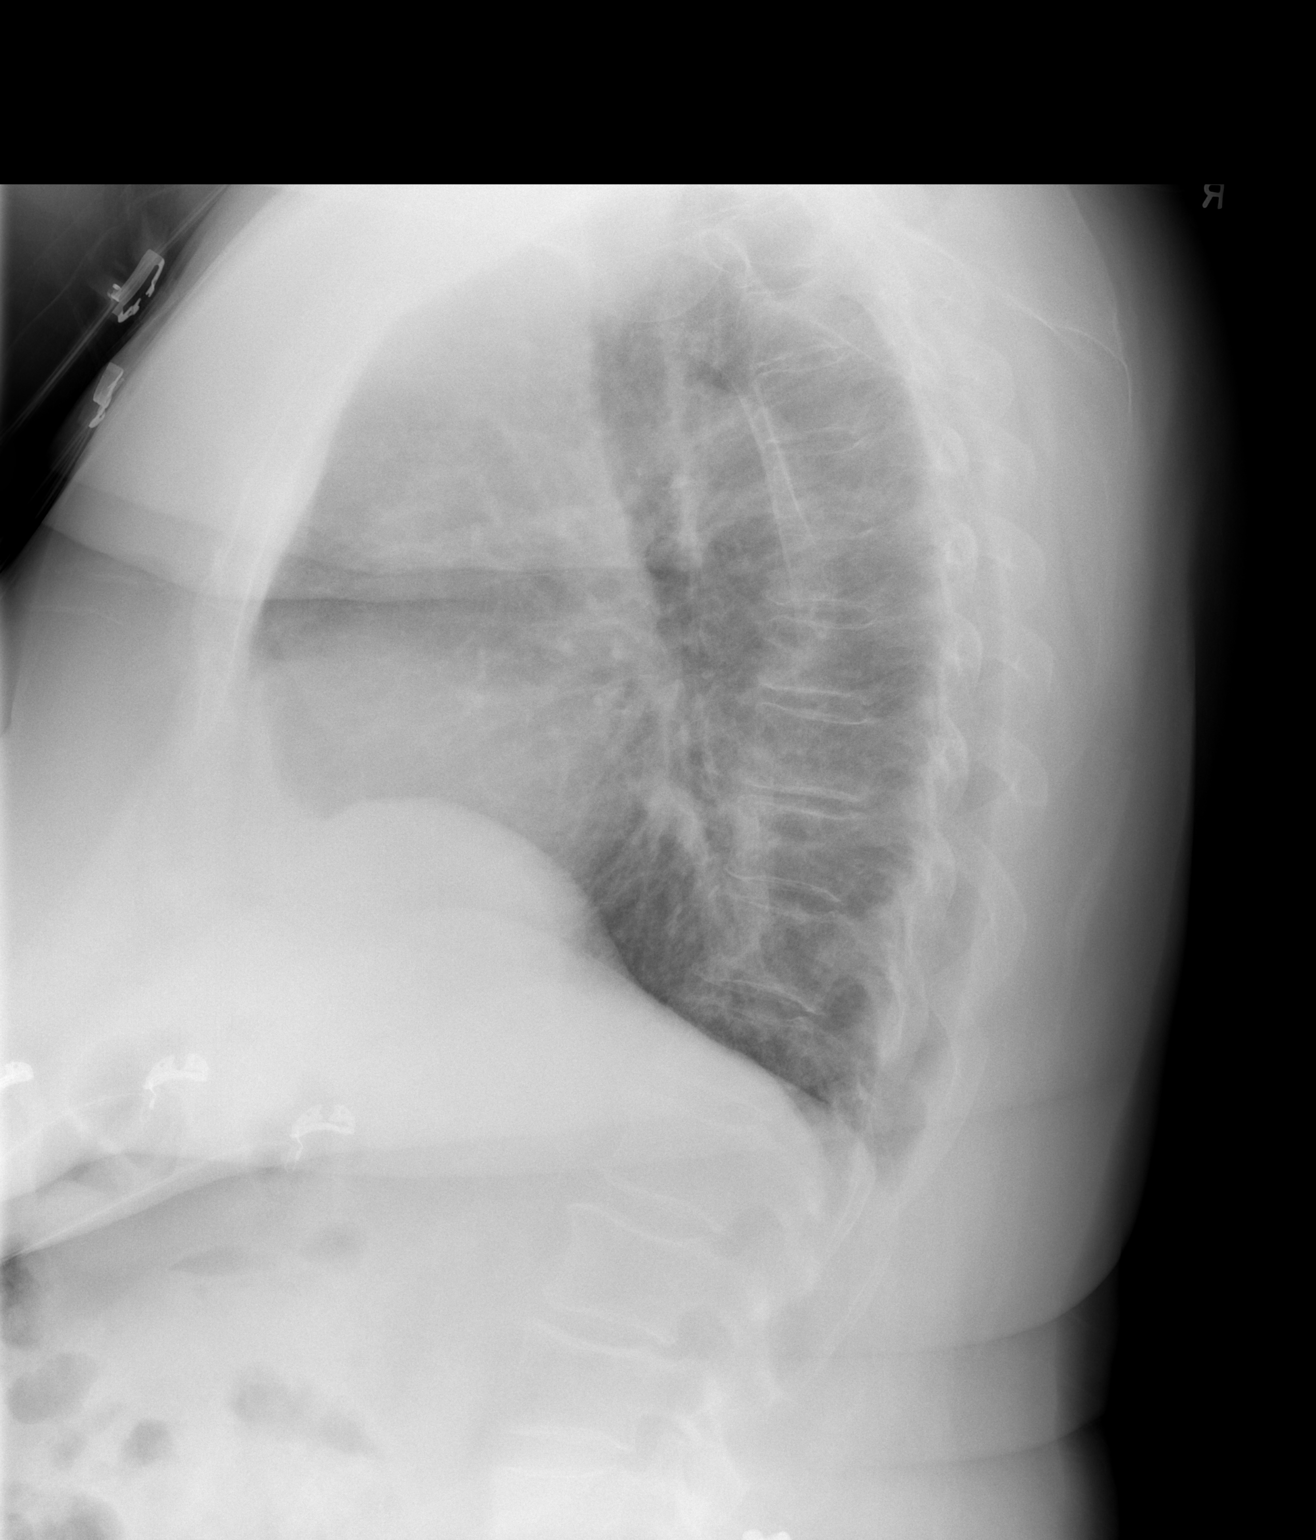

[2 of 2 positions shown; findings below may reference images not displayed]

FINDINGS: The heart size and mediastinal contours are stable with mild
cardiomegaly and aortic atherosclerosis. The lungs are clear. There
is no pleural effusion or pneumothorax. Stable mild degenerative
changes in the spine. Telemetry leads overlie the chest.
IMPRESSION: Stable chest.  No active cardiopulmonary process.

## 2024-07-08 ENCOUNTER — Emergency Department (HOSPITAL_BASED_OUTPATIENT_CLINIC_OR_DEPARTMENT_OTHER)

## 2024-07-08 ENCOUNTER — Emergency Department (HOSPITAL_BASED_OUTPATIENT_CLINIC_OR_DEPARTMENT_OTHER)
Admission: EM | Admit: 2024-07-08 | Discharge: 2024-07-08 | Disposition: A | Discharge status: Home / Self Care | Attending: Emergency Medicine | Admitting: Emergency Medicine

## 2024-07-08 ENCOUNTER — Other Ambulatory Visit: Payer: Self-pay

## 2024-07-08 ENCOUNTER — Encounter (HOSPITAL_BASED_OUTPATIENT_CLINIC_OR_DEPARTMENT_OTHER): Payer: Self-pay

## 2024-07-08 DIAGNOSIS — W19XXXA Unspecified fall, initial encounter: Secondary | ICD-10-CM | POA: Insufficient documentation

## 2024-07-08 DIAGNOSIS — E119 Type 2 diabetes mellitus without complications: Secondary | ICD-10-CM | POA: Diagnosis not present

## 2024-07-08 DIAGNOSIS — M25521 Pain in right elbow: Secondary | ICD-10-CM | POA: Diagnosis present

## 2024-07-08 DIAGNOSIS — J45909 Unspecified asthma, uncomplicated: Secondary | ICD-10-CM | POA: Insufficient documentation

## 2024-07-08 MED ORDER — SENNOSIDES-DOCUSATE SODIUM 8.6-50 MG PO TABS: 1.0000 | ORAL_TABLET | Freq: Every evening | ORAL | 0 refills | Status: AC | PRN

## 2024-07-08 MED ORDER — HYDROCODONE-ACETAMINOPHEN 5-325 MG PO TABS: 1.0000 | ORAL_TABLET | Freq: Four times a day (QID) | ORAL | 0 refills | Status: AC | PRN

## 2024-07-08 NOTE — ED Provider Notes (Signed)
 Emergency Department Provider Note   I have reviewed the triage vital signs and the nursing notes.   HISTORY  Chief Complaint Arm Injury   HPI Autumn Barton is a 78 y.o. female presents to the emergency department right elbow pain after fall yesterday.  Patient had a mechanical fall while taking in the recycling bins.  She did not strike her head.  She landed primarily on the right elbow.  She notes soreness worse with movement with some mild swelling.  No lacerations.  No numbness.  Pain kept her up most of the night with pain radiating into the forearm.  Past Medical History:  Diagnosis Date   Asthma    Diabetes mellitus     Review of Systems  Constitutional: No fever/chills Cardiovascular: Denies chest pain. Respiratory: Denies shortness of breath. Gastrointestinal: No abdominal pain.   Musculoskeletal: Positive right elbow pain.  Skin: Negative for rash.  ____________________________________________   PHYSICAL EXAM:  VITAL SIGNS: ED Triage Vitals  Encounter Vitals Group     BP 07/08/24 0707 (!) 143/84     Pulse Rate 07/08/24 0707 66     Resp 07/08/24 0707 18     Temp 07/08/24 0707 97.8 F (36.6 C)     Temp Source 07/08/24 0707 Oral     SpO2 07/08/24 0707 98 %   Constitutional: Alert and oriented. Well appearing and in no acute distress. Eyes: Conjunctivae are normal. Head: Atraumatic. Nose: No congestion/rhinnorhea. Mouth/Throat: Mucous membranes are moist.  Neck: No stridor.  Cardiovascular: Normal rate, regular rhythm. Good peripheral circulation. Grossly normal heart sounds.   Respiratory: Normal respiratory effort.  Gastrointestinal: No distention.  Musculoskeletal: Tenderness over the radial head and the right elbow with mild effusion on exam.  No skin breakdown or laceration.  Compartments are soft.  No tenderness to the wrist or scaphoid. Neurologic:  Normal speech and language.  Normal strength and sensation in the right upper  extremity. Skin:  Skin is warm, dry and intact. No rash noted.  ____________________________________________  RADIOLOGY  DG Elbow Complete Right Result Date: 07/08/2024 CLINICAL DATA:  78 year old female status post fall yesterday. Continued pain. EXAM: RIGHT ELBOW - COMPLETE 3+ VIEW COMPARISON:  Right forearm series today. FINDINGS: Four views. Posterior fat pad is visible on the lateral suggesting a small joint effusion. Widespread joint space loss at the right elbow, subchondral sclerosis, moderate degenerative osteophytosis. No discrete fracture is identified. Mild regional subcutaneous soft tissue stranding such as mild contusion or hematoma. IMPRESSION: 1. Small joint effusion suspected, but no fracture is identified. Consider occult fracture of the proximal radius if symptoms persist. 2. Moderate to severe degenerative changes. 3. Mild superficial soft tissue injury. Electronically Signed   By: VEAR Hurst M.D.   On: 07/08/2024 07:39   DG Forearm Right Result Date: 07/08/2024 CLINICAL DATA:  78 year old female status post fall yesterday. Continued pain. EXAM: RIGHT FOREARM - 2 VIEW COMPARISON:  None Available. FINDINGS: Two views. Bone mineralization is within normal limits for age. Elbow is detailed separately today. Right radius and ulna appear intact. Grossly maintained alignment at the right wrist. Some radiocarpal joint space loss and subchondral sclerosis noted. Severe chronic degeneration at the thumb metacarpal joint. No discrete soft tissue injury. Calcified peripheral vascular disease. IMPRESSION: No acute fracture or dislocation identified about the right forearm. Electronically Signed   By: VEAR Hurst M.D.   On: 07/08/2024 07:36    ____________________________________________   PROCEDURES  Procedure(s) performed:   Procedures  None  ____________________________________________  INITIAL IMPRESSION / ASSESSMENT AND PLAN / ED COURSE  Pertinent labs & imaging results that were  available during my care of the patient were reviewed by me and considered in my medical decision making (see chart for details).   This patient is Presenting for Evaluation of arm pain, which does require a range of treatment options, and is a complaint that involves a moderate risk of morbidity and mortality.  The Differential Diagnoses include contusion, occult fracture, dislocation, sprain, etc.  Radiologic Tests Ordered, included XR right elbow and forearm. I independently interpreted the images and agree with radiology interpretation.   Medical Decision Making: Summary:  The patient presents emergency department with pain in the right elbow with mild swelling.  X-ray does not show any obvious fracture but does show a small joint effusion.  On exam she does have tenderness over the elbow and radial head.  Plan to splint with concern for possible occult fracture.  Provided contact information for orthopedics and advised that she call this coming week for follow-up and repeat imaging. Patient verbalizes understanding and family at bedside.  She is unable to take OTC pain medications and so sent a small number of Vicodin to her pharmacy with strong precautions.  PDMP reviewed.   Patient's presentation is most consistent with acute, uncomplicated illness.   Disposition: discharge  ____________________________________________  FINAL CLINICAL IMPRESSION(S) / ED DIAGNOSES  Final diagnoses:  Fall, initial encounter  Right elbow pain     NEW OUTPATIENT MEDICATIONS STARTED DURING THIS VISIT:  New Prescriptions   HYDROCODONE -ACETAMINOPHEN  (NORCO/VICODIN) 5-325 MG TABLET    Take 1 tablet by mouth every 6 (six) hours as needed for severe pain (pain score 7-10).   SENNA-DOCUSATE (SENOKOT-S) 8.6-50 MG TABLET    Take 1 tablet by mouth at bedtime as needed for mild constipation or moderate constipation.    Note:  This document was prepared using Dragon voice recognition software and may include  unintentional dictation errors.  Fonda Law, MD, [facility] Emergency Medicine    Aadvik Roker, Fonda MATSU, MD 07/08/24 [phone]

## 2024-07-08 NOTE — Discharge Instructions (Signed)
 Your x-rays today did not show any obvious broken bones but you have some fluid on your elbow joint and tenderness on exam.  You may have a small crack or break that is difficult to pick up on the initial x-ray.  I am putting you in a splint as a precaution and will have you call the orthopedic office on Tuesday to schedule a follow-up appointment in the coming week.  They can repeat x-rays at that time and decide if you need to continue the splint.

## 2024-07-08 NOTE — ED Triage Notes (Signed)
 Reports fall yesterday. States she leaned on recycle bin, wheels came out from under her. Denies head injury. No blood thinners, no LOC.   Reports pain in R arm, no swelling noted, denies loss of sensation or mobility.

## 2024-09-25 ENCOUNTER — Encounter: Payer: Self-pay | Admitting: Internal Medicine

## 2024-09-25 ENCOUNTER — Ambulatory Visit (INDEPENDENT_AMBULATORY_CARE_PROVIDER_SITE_OTHER): Admitting: Internal Medicine

## 2024-09-25 VITALS — BP 132/70 | HR 63 | Temp 97.5°F | Resp 20 | Ht 62.0 in | Wt 177.1 lb

## 2024-09-25 DIAGNOSIS — H1045 Other chronic allergic conjunctivitis: Secondary | ICD-10-CM | POA: Diagnosis not present

## 2024-09-25 DIAGNOSIS — Z8639 Personal history of other endocrine, nutritional and metabolic disease: Secondary | ICD-10-CM

## 2024-09-25 DIAGNOSIS — J3089 Other allergic rhinitis: Secondary | ICD-10-CM

## 2024-09-25 DIAGNOSIS — J4540 Moderate persistent asthma, uncomplicated: Secondary | ICD-10-CM | POA: Diagnosis not present

## 2024-09-25 DIAGNOSIS — I5032 Chronic diastolic (congestive) heart failure: Secondary | ICD-10-CM | POA: Diagnosis not present

## 2024-09-25 NOTE — Progress Notes (Signed)
 NEW PATIENT Date of Service/Encounter:  09/25/24 Referring provider: Arnulfo Raring, FNP Primary care provider: Arnulfo Raring, FNP  Subjective:  Autumn Barton is a 78 y.o. female  presenting today for evaluation of asthma/dyspnea History obtained from: chart review and patient.   Discussed the use of AI scribe software for clinical note transcription with the patient, who gave verbal consent to proceed.  History of Present Illness Autumn Barton is an 78 year old female with asthma who presents with worsening shortness of breath.  Dyspnea and wheezing - Worsening shortness of breath over the past two months, particularly with physical exertion such as walking - Occasional wheezing present - No chest pain or chest tightness - No shortness of breath when lying flat - No swelling in the feet or unexplained weight gain - Recent unintentional weight loss - history of HFpEF - f/b cardiology   Asthma management and medication use - No use of inhalers since 2021, when last prescribed Symbicort  and Spiriva  - No current rescue inhaler such as albuterol  - No use of prednisone  or other oral steroids in the past year - When using inhalers in 2021, experienced variable symptom control  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 - History of COVID-19 requiring hospitalization - Worsening of respiratory symptoms since COVID-19 infection - No further hospitalizations or emergency room visits since COVID-19  Allergic rhinitis and environmental triggers - Recent flare of allergy symptoms, including nasal congestion and watery eyes, particularly in the fall - Previously took allergy medication but has run out and cannot recall the name - Exposure to strong odors and perfumes triggers coughing - No symptoms of reflux or heartburn - no animal triggers  - no prior allergy testing   She has a history of diabetes, with hyperglycemia during prednisone  courses   Chart Review:  10/2019: 2 week  hospitalization for COVID, asthma symptoms re-emerging after covid  AAC 05/08/2020, started on Symbicort  160 mcg, given prednisone ; started on Astelin /Flonase , Pataday  Spiriva  added 09/01/2020, no follow up since   Other allergy screening: Asthma: yes Rhino conjunctivitis: yes Food allergy: no Medication allergy: no Hymenoptera allergy: no Urticaria: no Eczema:no History of recurrent infections suggestive of immunodeficency: no Vaccinations are up to date.   Past Medical History: Past Medical History:  Diagnosis Date   Asthma    Diabetes mellitus    Medication List:  Current Outpatient Medications  Medication Sig Dispense Refill   amLODipine  (NORVASC ) 5 MG tablet Take 5 mg by mouth every morning.     fluticasone  (FLONASE ) 50 MCG/ACT nasal spray Place 2 sprays into both nostrils daily as needed for allergies or rhinitis.     acetaminophen  (TYLENOL ) 500 MG tablet Take 1,000 mg by mouth every 6 (six) hours as needed for headache (pain). (Patient not taking: Reported on 09/25/2024)     albuterol  (PROAIR  HFA) 108 (90 Base) MCG/ACT inhaler Inhale 2 puffs into the lungs every 4 (four) hours as needed for wheezing or shortness of breath. (Patient not taking: Reported on 09/25/2024) 1 each 1   cyanocobalamin  (,VITAMIN B-12,) 1000 MCG/ML injection Inject 1,000 mcg into the skin every 30 (thirty) days. (Patient not taking: Reported on 09/25/2024)     diclofenac Sodium (VOLTAREN) 1 % GEL Apply 2 g topically 4 (four) times daily as needed (pain). (Patient not taking: Reported on 09/25/2024)     ferrous sulfate  325 (65 FE) MG tablet Take 1 tablet (325 mg total) by mouth daily with breakfast. (Patient not taking: Reported on 09/25/2024) 30 tablet 0   Finerenone (KERENDIA)  10 MG TABS Take by mouth.     HYDROcodone -acetaminophen  (NORCO/VICODIN) 5-325 MG tablet Take 1 tablet by mouth every 6 (six) hours as needed for severe pain (pain score 7-10). (Patient not taking: Reported on 09/25/2024) 10 tablet 0    hydrOXYzine  (ATARAX /VISTARIL ) 10 MG tablet Take 10 mg by mouth 3 (three) times daily as needed for itching. (Patient not taking: Reported on 09/25/2024)     isosorbide mononitrate (IMDUR) 30 MG 24 hr tablet Take 30 mg by mouth daily.     lidocaine  (LIDODERM ) 5 % Place 1 patch onto the skin daily as needed (pain).     metoprolol succinate (TOPROL-XL) 25 MG 24 hr tablet Take 25 mg by mouth daily.     montelukast  (SINGULAIR ) 10 MG tablet Take 10 mg by mouth every morning.     omeprazole (PRILOSEC) 20 MG capsule Take 20 mg by mouth daily.     pantoprazole  (PROTONIX ) 40 MG tablet Take 1 tablet (40 mg total) by mouth daily. 30 tablet 0   predniSONE  (DELTASONE ) 50 MG tablet Take 1 tablet (50 mg total) by mouth daily with breakfast. 5 tablet 0   senna-docusate (SENOKOT-S) 8.6-50 MG tablet Take 1 tablet by mouth at bedtime as needed for mild constipation or moderate constipation. (Patient not taking: Reported on 09/25/2024) 20 tablet 0   sitaGLIPtin (JANUVIA) 100 MG tablet Take 100 mg by mouth every morning. (Patient not taking: Reported on 09/25/2024)     SYMBICORT  160-4.5 MCG/ACT inhaler INHALE 2 PUFFS INTO THE LUNGS IN THE MORNING AND AT BEDTIME. USE WITH SPACER. RINSE, GARGLE AND SPIT OUT AFTER USE. (Patient not taking: Reported on 09/25/2024) 10.2 each 5   Vitamin D , Ergocalciferol , (DRISDOL ) 1.25 MG (50000 UT) CAPS capsule Take 50,000 Units by mouth every Sunday.     zolpidem  (AMBIEN ) 5 MG tablet Take 5 mg by mouth at bedtime as needed for sleep.     No current facility-administered medications for this visit.   Known Allergies:  Allergies  Allergen Reactions   Adhesive [Tape] Other (See Comments)    BRUISES SKIN PER PATIENT REPORT PAPER TAPE OK PER PATIENT   Aspirin Nausea Only   Ibuprofen Nausea Only   Past Surgical History: Past Surgical History:  Procedure Laterality Date   BACK SURGERY  2020   BIOPSY  02/11/2021   Procedure: BIOPSY;  Surgeon: Eda Iha, MD;  Location: [facility]  ENDOSCOPY;  Service: Gastroenterology;;   COLONOSCOPY WITH PROPOFOL  N/A 02/11/2021   Procedure: COLONOSCOPY WITH PROPOFOL ;  Surgeon: Eda Iha, MD;  Location: [facility] ENDOSCOPY;  Service: Gastroenterology;  Laterality: N/A;   COSMETIC SURGERY     ESOPHAGOGASTRODUODENOSCOPY (EGD) WITH PROPOFOL  N/A 02/11/2021   Procedure: ESOPHAGOGASTRODUODENOSCOPY (EGD) WITH PROPOFOL ;  Surgeon: Eda Iha, MD;  Location: [facility] ENDOSCOPY;  Service: Gastroenterology;  Laterality: N/A;   GIVENS CAPSULE STUDY  02/11/2021   Procedure: GIVENS CAPSULE STUDY;  Surgeon: Eda Iha, MD;  [facility] ENDOSCOPY;  Service: Gastroenterology;;   TOTAL KNEE ARTHROPLASTY     Family History: Family History  Problem Relation Age of Onset   Eczema Son    Social History: [place] lives single-family home.  No water damage carpet throughout.  Electric heating central cooling.  No roaches in the bed is to be off floor.  No dust mite precautions.  Is retired..  Previously worked in the [employer]:  All other systems negative except as noted per HPI.  Objective:  Resp. rate 20, height 5' 2 (1.575 m), weight 177 lb 1.6 oz (  80.3 kg). Body mass index is 32.39 kg/m. Physical Exam:  General Appearance:  Alert, cooperative, no distress, appears stated age  Head:  Normocephalic, without obvious abnormality, atraumatic  Eyes:  Conjunctiva clear, EOM's intact  Ears EACs normal bilaterally and normal TMs bilaterally  Nose: Nares normal, normal mucosa, no visible anterior polyps, and septum midline  Throat: Lips, tongue normal; , dentures   Neck: Supple, symmetrical  Lungs:   clear to auscultation bilaterally, Respirations unlabored, no coughing  Heart:  regular rate and rhythm and no murmur, Appears well perfused  Extremities: No edema  Skin: Skin color, texture, turgor normal and no rashes or lesions on visualized portions of skin  Neurologic: No gross deficits   Diagnostics: Spirometry:  Tracings reviewed. Her  effort: Good reproducible efforts. FVC: 1.50 L (pre), 1.70L  (post) FEV1: 0.99 L, 92% predicted (pre), 1.31L, 122%% predicted (post) FEV1/FVC ratio: 66 (pre), 0.77 (post) Interpretation: Spirometry consistent with normal pattern.  Please see scanned spirometry results for details.   Labs:  Lab Orders  No laboratory test(s) ordered today     Assessment and Plan  Assessment and Plan Assessment & Plan Moderate persistent asthma, not well controlled  Asthma poorly controlled with exertional dyspnea, worsened post-COVID. Breathing test indicates suboptimal control. History of HFpEF, but stable per last cardiology note.  Exam not consistent with volume overload  Breathing tests showed: significant reversibility with albuterol    - Controller Inhaler: Start Symbicort  160mcg  2 puffs twice a day; This Should Be Used Everyday - Rinse mouth out after use - During respiratory illness or asthma flares: Increase Symbicort  4 puffs  and continue for 2 weeks or until symptoms resolve. - Rescue Inhaler: Albuterol  (Proair /Ventolin ) 2 puffs . Use  every 4-6 hours as needed for chest tightness, wheezing, or coughing.  Can also use 15 minutes prior to exercise if you have symptoms with activity. - Asthma is not controlled if:  - Symptoms are occurring >2 times a week OR  - >2 times a month nighttime awakenings  - You are requiring systemic steroids (prednisone /steroid injections) more than once per year  - Your require hospitalization for your asthma.  - Please call the clinic to schedule a follow up if these symptoms arise  .Recommend annual flu and covid booster Recommend RSV vaccine if not done yet   Allergic rhinitis with seasonal exacerbation Allergic rhinitis with fall exacerbation, causing watery eyes and nasal congestion. Symptoms occur intermittently. - Start taking: Zyrtec (cetirizine) 10mg  tablet once daily, Flonase  (fluticasone ) two sprays per nostril daily, and Astelin  (azelastine ) 2  sprays per nostril 1-2 times daily as needed    Follow up: in 4 weeks    This note in its entirety was forwarded to the Provider who requested this consultation.  Other: spacer provided , reviewed spirometry technique, and reviewed inhaler technique  Thank you for your kind referral. I appreciate the opportunity to take part in [place] care. Please do not hesitate to contact me with questions.  Sincerely,  Thank you so much for letting me partake in your care today.  Don't hesitate to reach out if you have any additional concerns!  Hargis Springer, MD  [facility] [place], [place]

## 2024-09-25 NOTE — Patient Instructions (Signed)
 Moderate persistent asthma, not well controlled  Asthma poorly controlled with exertional dyspnea, worsened post-COVID. Breathing test indicates suboptimal control. History of HFpEF, but stable per last cardiology note.  Exam not consistent with volume overload  Breathing tests showed: significant reversibility with albuterol    - Controller Inhaler: Start Symbicort  160mcg  2 puffs twice a day; This Should Be Used Everyday - Rinse mouth out after use - During respiratory illness or asthma flares: Increase Symbicort  4 puffs  and continue for 2 weeks or until symptoms resolve. - Rescue Inhaler: Albuterol  (Proair /Ventolin ) 2 puffs . Use  every 4-6 hours as needed for chest tightness, wheezing, or coughing.  Can also use 15 minutes prior to exercise if you have symptoms with activity. - Asthma is not controlled if:  - Symptoms are occurring >2 times a week OR  - >2 times a month nighttime awakenings  - You are requiring systemic steroids (prednisone /steroid injections) more than once per year  - Your require hospitalization for your asthma.  - Please call the clinic to schedule a follow up if these symptoms arise  .Recommend annual flu and covid booster Recommend RSV vaccine if not done yet   Allergic rhinitis with seasonal exacerbation Allergic rhinitis with fall exacerbation, causing watery eyes and nasal congestion. Symptoms occur intermittently. - Start taking: Zyrtec (cetirizine) 10mg  tablet once daily, Flonase  (fluticasone ) two sprays per nostril daily, and Astelin  (azelastine ) 2 sprays per nostril 1-2 times daily as needed    Follow up: in 4 weeks

## 2024-09-27 ENCOUNTER — Telehealth: Payer: Self-pay | Admitting: Internal Medicine

## 2024-09-27 MED ORDER — ALBUTEROL SULFATE HFA 108 (90 BASE) MCG/ACT IN AERS: 2.0000 | INHALATION_SPRAY | RESPIRATORY_TRACT | 1 refills | Status: AC | PRN

## 2024-09-27 MED ORDER — FLUTICASONE PROPIONATE 50 MCG/ACT NA SUSP: 2.0000 | Freq: Every day | NASAL | 5 refills | Status: AC | PRN

## 2024-09-27 MED ORDER — CETIRIZINE HCL 10 MG PO TABS: 10.0000 mg | ORAL_TABLET | Freq: Every day | ORAL | 5 refills | Status: AC

## 2024-09-27 MED ORDER — BUDESONIDE-FORMOTEROL FUMARATE 160-4.5 MCG/ACT IN AERO: 2.0000 | INHALATION_SPRAY | Freq: Two times a day (BID) | RESPIRATORY_TRACT | 5 refills | Status: AC

## 2024-09-27 MED ORDER — AZELASTINE HCL 0.1 % NA SOLN: NASAL | 5 refills | Status: AC

## 2024-09-27 NOTE — Telephone Encounter (Signed)
 Symbicort , Albuterol , Zrytec, Flonase , Azelastine  all sent to [facility] on [address]. Tried calling pt, no answer and unable to leave voicemail.

## 2024-09-27 NOTE — Telephone Encounter (Signed)
 Patient called stating she was seen on Tuesday and none of her medications were sent into the pharmacy. Patient would like a call once medications are sent in.

## 2024-10-29 ENCOUNTER — Encounter: Payer: Self-pay | Admitting: Internal Medicine

## 2024-10-29 ENCOUNTER — Ambulatory Visit: Admitting: Internal Medicine

## 2024-10-29 VITALS — BP 124/76 | HR 64 | Temp 98.0°F | Resp 18 | Wt 181.3 lb

## 2024-10-29 DIAGNOSIS — J4540 Moderate persistent asthma, uncomplicated: Secondary | ICD-10-CM | POA: Diagnosis not present

## 2024-10-29 DIAGNOSIS — J3089 Other allergic rhinitis: Secondary | ICD-10-CM

## 2024-10-29 DIAGNOSIS — R040 Epistaxis: Secondary | ICD-10-CM

## 2024-10-29 DIAGNOSIS — H1045 Other chronic allergic conjunctivitis: Secondary | ICD-10-CM | POA: Diagnosis not present

## 2024-10-29 DIAGNOSIS — Z8639 Personal history of other endocrine, nutritional and metabolic disease: Secondary | ICD-10-CM | POA: Diagnosis not present

## 2024-10-29 DIAGNOSIS — I5032 Chronic diastolic (congestive) heart failure: Secondary | ICD-10-CM

## 2024-10-29 MED ORDER — SPIRIVA RESPIMAT 2.5 MCG/ACT IN AERS: 2.0000 | INHALATION_SPRAY | RESPIRATORY_TRACT | 5 refills | Status: AC

## 2024-10-29 NOTE — Progress Notes (Signed)
 "  FOLLOW UP Date of Service/Encounter:  10/29/2024  Subjective:  Autumn Barton (DOB: 1946-03-31) is a 78 y.o. female who [facility] in re-evaluation of the following: persistent asthma, rhinitis  History obtained from: chart review and patient.  For Review, LV was on 09/25/24  with Dr. Lorin seen for intial visit for asthma, rhiniits . See below for summary of history and diagnostics.   Therapeutic plans/changes recommended: started symbicort ,  ----------------------------------------------------- Pertinent History/Diagnostics:  Asthma: moderate persistent  10/2019: 2 week hospitalization for COVID, asthma symptoms re-emerging after covid  AAC 05/08/2020, started on Symbicort  160 mcg, given prednisone ; started on Astelin /Flonase , Pataday  Spiriva  added 09/01/2020, no follow up since -Symbicort  restarted 09/25/2024 -  spirometry (09/25/2024): ratio 66%, 0.99 L, 92% FEV1 (pre),  1.31 L, 122% FEV1 (post) Allergic Rhinitis:  Nasal congestion watery eyes, worse in fall, triggered with strong odors -Rx: Zyrtec , Flonase , Astelin   Other: Diabetes with hyperglycemia during prednisone , HFpEF followed by cardiology --------------------------------------------------- Today presents for follow-up. Discussed the use of AI scribe software for clinical note transcription with the patient, who gave verbal consent to proceed.  History of Present Illness Autumn Barton is an 78 year old female with asthma and heart failure who presents with persistent respiratory symptoms.  Dyspnea and exercise tolerance - Shortness of breath during long walks and exercise, slightly improved since last visit but still present - Uses rescue inhaler during episodes, which provides relief - Breathing test results have improved since last visit four weeks ago, but no significant subjective improvement  Upper respiratory symptoms - Runny nose and epistaxis when blowing nose  in the morning - Currently using nasal sprays including Flonase , Zyrtec , and azelastine   Asthma management - Currently on Symbicort  2 puffs BID which has helped improve breathing test results and some improvement in asthma symptoms   Heart failure status - History of heart failure, reportedly controlled according to cardiologist - Upcoming appointment with cardiologist next week     All medications reviewed by clinical staff and updated in chart. No new pertinent medical or surgical history except as noted in HPI.  ROS: All others negative except as noted per HPI.   Objective:  BP 124/76   Pulse 64   Temp 98 F (36.7 C)   Resp 18   Wt 181 lb 4.8 oz (82.2 kg)   SpO2 98%   BMI 33.16 kg/m  Body mass index is 33.16 kg/m. Physical Exam: General Appearance:  Alert, cooperative, no distress, appears stated age  Head:  Normocephalic, without obvious abnormality, atraumatic  Eyes:  Conjunctiva clear, EOM's intact  Ears Mild serous fluid in left ear  and EACs normal bilaterally  Nose: Nares normal, hypertrophic turbinates, normal mucosa, no visible anterior polyps, and septum midline  Throat: Lips, tongue normal; teeth and gums normal, normal posterior oropharynx  Neck: Supple, symmetrical  Lungs:   clear to auscultation bilaterally, Respirations unlabored, no coughing  Heart:  regular rate and rhythm and no murmur, Appears well perfused  Extremities: No edema  Skin: Skin color, texture, turgor normal and no rashes or lesions on visualized portions of skin  Neurologic: No gross deficits   Labs:  Lab Orders  No laboratory test(s) ordered today    Spirometry:  Tracings reviewed. Her effort: Good reproducible efforts. FVC: 1.88L FEV1: 1.43L, 134% predicted FEV1/FVC ratio: 76% Interpretation: Spirometry consistent with normal pattern.  Please see scanned spirometry results for details.   Assessment/Plan   Patient Instructions  Moderate  persistent asthma, improved  but still symptomatic Breathing tests showed: Looked great!  - Controller Inhaler: Continue Symbicort  160mcg  2 puffs twice a day; This Should Be Used Everyday - Rinse mouth out after use  -Start Spiriva  2 puffs daily  -It can take up to 6 weeks to see the full effect of Spiriva  - During respiratory illness or asthma flares: Increase Symbicort  4 puffs  and continue for 2 weeks or until symptoms resolve. - Rescue Inhaler: Albuterol  (Proair /Ventolin ) 2 puffs . Use  every 4-6 hours as needed for chest tightness, wheezing, or coughing.  Can also use 15 minutes prior to exercise if you have symptoms with activity. - Asthma is not controlled if:  - Symptoms are occurring >2 times a week OR  - >2 times a month nighttime awakenings  - You are requiring systemic steroids (prednisone /steroid injections) more than once per year  - Your require hospitalization for your asthma.  - Please call the clinic to schedule a follow up if these symptoms arise  Recommend annual flu and covid booster Recommend RSV vaccine if not done yet   Allergic rhinitis with epistaxis - Continue taking: Zyrtec  (cetirizine ) 10mg  tablet once daily,  and Astelin  (azelastine ) 2 sprays per nostril 1-2 times daily as needed - Start taking: Sinus rinses 1-2 times daily at least 10 to 15 minutes before you no spray - STOP:  flonase , this is likely the cause of your nose bleeds   Follow up: 4 months    Other: reviewed spirometry technique and reviewed inhaler technique  Thank you so much for letting me partake in your care today.  Don't hesitate to reach out if you have any additional concerns!  Hargis Springer, MD  [facility] [place], [place]        "

## 2024-10-29 NOTE — Patient Instructions (Addendum)
 Moderate persistent asthma, improved but still symptomatic Breathing tests showed: Looked great!  - Controller Inhaler: Continue Symbicort  160mcg  2 puffs twice a day; This Should Be Used Everyday - Rinse mouth out after use  -Start Spiriva  2 puffs daily  -It can take up to 6 weeks to see the full effect of Spiriva  - During respiratory illness or asthma flares: Increase Symbicort  4 puffs  and continue for 2 weeks or until symptoms resolve. - Rescue Inhaler: Albuterol  (Proair /Ventolin ) 2 puffs . Use  every 4-6 hours as needed for chest tightness, wheezing, or coughing.  Can also use 15 minutes prior to exercise if you have symptoms with activity. - Asthma is not controlled if:  - Symptoms are occurring >2 times a week OR  - >2 times a month nighttime awakenings  - You are requiring systemic steroids (prednisone /steroid injections) more than once per year  - Your require hospitalization for your asthma.  - Please call the clinic to schedule a follow up if these symptoms arise  Recommend annual flu and covid booster Recommend RSV vaccine if not done yet   Allergic rhinitis with epistaxis - Continue taking: Zyrtec  (cetirizine ) 10mg  tablet once daily,  and Astelin  (azelastine ) 2 sprays per nostril 1-2 times daily as needed - Start taking: Sinus rinses 1-2 times daily at least 10 to 15 minutes before you no spray - STOP:  flonase , this is likely the cause of your nose bleeds   Follow up: 4 months
